# Patient Record
Sex: Female | Born: 1980 | Hispanic: No | Marital: Married | Smoking: Never smoker
Health system: Southern US, Community
[De-identification: ages and names within clinical notes are randomized; demographics above are authoritative.]

## PROBLEM LIST (undated history)

## (undated) DIAGNOSIS — N90810 Female genital mutilation status, unspecified: Secondary | ICD-10-CM

## (undated) DIAGNOSIS — T7840XA Allergy, unspecified, initial encounter: Secondary | ICD-10-CM

## (undated) HISTORY — DX: Allergy, unspecified, initial encounter: T78.40XA

## (undated) HISTORY — PX: CIRCUMCISION: SUR203

## (undated) HISTORY — DX: Female genital mutilation status, unspecified: N90.810

---

## 2007-03-24 ENCOUNTER — Inpatient Hospital Stay (HOSPITAL_COMMUNITY): Admission: AD | Admit: 2007-03-24 | Discharge: 2007-03-24 | Payer: Self-pay | Admitting: Obstetrics & Gynecology

## 2007-03-24 ENCOUNTER — Ambulatory Visit: Payer: Self-pay | Admitting: Physician Assistant

## 2007-04-11 ENCOUNTER — Ambulatory Visit: Payer: Self-pay | Admitting: Obstetrics & Gynecology

## 2007-05-02 ENCOUNTER — Ambulatory Visit (HOSPITAL_COMMUNITY): Admission: RE | Admit: 2007-05-02 | Discharge: 2007-05-02 | Payer: Self-pay | Admitting: Family Medicine

## 2007-05-02 ENCOUNTER — Ambulatory Visit: Payer: Self-pay | Admitting: Obstetrics & Gynecology

## 2007-05-30 ENCOUNTER — Encounter: Payer: Self-pay | Admitting: Obstetrics and Gynecology

## 2007-05-30 ENCOUNTER — Ambulatory Visit: Payer: Self-pay | Admitting: Obstetrics & Gynecology

## 2007-06-06 ENCOUNTER — Ambulatory Visit: Payer: Self-pay | Admitting: Obstetrics & Gynecology

## 2007-06-13 ENCOUNTER — Ambulatory Visit: Payer: Self-pay | Admitting: Obstetrics & Gynecology

## 2007-06-18 ENCOUNTER — Ambulatory Visit: Payer: Self-pay | Admitting: Obstetrics and Gynecology

## 2007-06-19 ENCOUNTER — Inpatient Hospital Stay (HOSPITAL_COMMUNITY): Admission: AD | Admit: 2007-06-19 | Discharge: 2007-06-19 | Payer: Self-pay | Admitting: Obstetrics and Gynecology

## 2007-06-19 ENCOUNTER — Ambulatory Visit: Payer: Self-pay | Admitting: Physician Assistant

## 2007-06-19 ENCOUNTER — Inpatient Hospital Stay (HOSPITAL_COMMUNITY): Admission: AD | Admit: 2007-06-19 | Discharge: 2007-06-21 | Payer: Self-pay | Admitting: Obstetrics & Gynecology

## 2007-08-13 ENCOUNTER — Other Ambulatory Visit: Admission: RE | Admit: 2007-08-13 | Discharge: 2007-08-13 | Payer: Self-pay | Admitting: Gynecology

## 2007-09-19 ENCOUNTER — Ambulatory Visit: Payer: Self-pay | Admitting: Obstetrics and Gynecology

## 2009-02-19 ENCOUNTER — Ambulatory Visit: Payer: Self-pay | Admitting: Obstetrics and Gynecology

## 2009-11-30 ENCOUNTER — Inpatient Hospital Stay (HOSPITAL_COMMUNITY): Admission: AD | Admit: 2009-11-30 | Discharge: 2009-12-02 | Payer: Self-pay | Admitting: Obstetrics and Gynecology

## 2010-03-28 ENCOUNTER — Encounter: Payer: Self-pay | Admitting: *Deleted

## 2010-05-20 LAB — CBC
HCT: 25.3 % — ABNORMAL LOW (ref 36.0–46.0)
HCT: 35.2 % — ABNORMAL LOW (ref 36.0–46.0)
Hemoglobin: 11.9 g/dL — ABNORMAL LOW (ref 12.0–15.0)
MCH: 30.9 pg (ref 26.0–34.0)
MCHC: 33.8 g/dL (ref 30.0–36.0)
MCHC: 34 g/dL (ref 30.0–36.0)
MCV: 92.4 fL (ref 78.0–100.0)
RDW: 14.1 % (ref 11.5–15.5)

## 2010-07-20 NOTE — Group Therapy Note (Signed)
[name], [name]              ACCOUNT NO.:  [id number]   MEDICAL RECORD NO.:  [id number]          PATIENT TYPE:  WOC   LOCATION:  [facility]                   FACILITY:  [facility]   PHYSICIAN:  Deirdre Christy Gentles, CNM       DATE OF BIRTH:  21-Nov-1980   DATE OF SERVICE:                                  CLINIC NOTE   REASON FOR VISIT:  Postpartum check and needs contraception.   HISTORY:  This is 30 year old G2, P2-0-02 who is now 4-weeks post partum  and doing well.  She is breast-feeding with supplementation.  She has  not had a period.  Her lochia stopped about a month ago.  She is back to  her usual activity.  She desires an IUD for contraception.  She is aware  of the alternatives into including Imferon, OCPs, and Depo-Provera.  She  is counseled, today, about the risks of procedure; including infection,  bleeding, damage to the back of the uterus.  She is also informed of the  possibility of hormonal side effects, and she was concerned about having  hair loss on the OCPs in the past.  Some of the minor side effects  include mood changes, headaches, breast tenderness, acne, nausea, and  she accepts the risks.  She is also counseled on the possibility of  failure or expulsion of the IUD, and the fact that she may have some  abnormal irregular spotting in her first several cycles; however, this  is probably variable since she is still breast-feeding.  She also notes,  that after a year or so, she may have very light menses or no menses;  and she understands and accepts the above using the husband as  Nurse, learning disability for all of that.   PHYSICAL EXAM:  VITAL SIGNS:  Temperature 97.9, pulse 81, BP 105/69,  weight 141.  GENERAL:  WN WD NAD.  HEENT:  Normal good dentition.  NECK:  Thyroid NSSP.  HEART:  RRR without murmur.  LUNGS:  Clear to auscultation bilateral.  ABDOMEN:  Soft, flat, nontender, no organomegaly.  A 4 cm rectus  diastasis.  EXTREMITIES:  Without varicosities or edema.  Pulses  full bilaterally.  PELVIC EXAM:  Normal external genitalia.  At this point Dr. Ancil Boozer is doing the pelvic exam, and IUD insertion.   PROCEDURE NOTE:  After informed consent as above.  The patient is in  dorsal lithotomy position.  Bimanual position confirms anteverted  uterus.  Speculum is inserted and cervix comes into view.  Anterior lip  is grasped with a single tooth tenaculum at 10 and 2 o'clock.  The  uterus sounded to 8 cm.  The Mirena IUD was inserted without difficulty.  Strings were cut to 2.5 cm.  The patient tolerated this procedure well.  There was very little spotting, and she was taught on how to do a string  check after her period, and they will be calling to get a string check  here after she does start having menses.           ______________________________  Caren Griffins, CNM    DP/MEDQ  D:  09/19/2007  T:  09/19/2007  Job:  [id number]

## 2010-11-26 LAB — POCT URINALYSIS DIP (DEVICE)
Glucose, UA: NEGATIVE
Glucose, UA: NEGATIVE
Nitrite: NEGATIVE
Operator id: 297281
Operator id: 297281
Specific Gravity, Urine: 1.015
Specific Gravity, Urine: 1.02
Urobilinogen, UA: 0.2
Urobilinogen, UA: 0.2

## 2010-11-26 LAB — URINALYSIS, ROUTINE W REFLEX MICROSCOPIC
Glucose, UA: NEGATIVE
Hgb urine dipstick: NEGATIVE
Specific Gravity, Urine: <1.005 — ABNORMAL LOW
Urobilinogen, UA: 0.2

## 2010-11-26 LAB — WET PREP, GENITAL: Clue Cells Wet Prep HPF POC: NONE SEEN

## 2010-11-29 LAB — POCT URINALYSIS DIP (DEVICE)
Glucose, UA: NEGATIVE
Nitrite: NEGATIVE
Operator id: 200901
Specific Gravity, Urine: 1.015
Urobilinogen, UA: 0.2

## 2010-11-30 LAB — POCT URINALYSIS DIP (DEVICE)
Glucose, UA: NEGATIVE
Glucose, UA: NEGATIVE
Nitrite: NEGATIVE
Nitrite: NEGATIVE
Operator id: 120861
Operator id: 120861
Protein, ur: NEGATIVE
Protein, ur: NEGATIVE
Urobilinogen, UA: 0.2
Urobilinogen, UA: 0.2

## 2010-11-30 LAB — CBC
MCV: 92.5
Platelets: 248
RDW: 12.8
WBC: 11.9 — ABNORMAL HIGH

## 2010-11-30 LAB — RAPID HIV SCREEN (WH-MAU): Rapid HIV Screen: NONREACTIVE

## 2010-11-30 LAB — RPR: RPR Ser Ql: NONREACTIVE

## 2010-12-03 LAB — POCT PREGNANCY, URINE: Operator id: 297281

## 2012-01-19 ENCOUNTER — Telehealth: Payer: Self-pay | Admitting: Obstetrics and Gynecology

## 2012-01-19 MED ORDER — TRIAMCINOLONE 0.1 % CREAM:EUCERIN CREAM 1:1: 1.0000 "application " | TOPICAL_CREAM | Freq: Three times a day (TID) | CUTANEOUS | Status: DC | PRN

## 2012-01-19 NOTE — Telephone Encounter (Signed)
Tc to pt's husband. Pt's needs rf for Triamcinolone cream for vulvar eczema. Pt was due for AEX after 12/30/11. Pt has to pay a balance before appt can be sched. Pt's husband voices understanding. Rx for Triamcinolone cream e-pres to pharm on file. Pt's husband declines sched AEX @ this time.

## 2012-05-09 ENCOUNTER — Encounter: Payer: Self-pay | Admitting: Obstetrics and Gynecology

## 2012-05-09 ENCOUNTER — Ambulatory Visit: Payer: BC Managed Care – PPO | Admitting: Obstetrics and Gynecology

## 2012-05-09 VITALS — BP 98/58 | Ht 67.5 in | Wt 158.0 lb

## 2012-05-09 LAB — HM PAP SMEAR: HM Pap smear: NEGATIVE

## 2012-05-09 MED ORDER — TRIAMCINOLONE 0.1 % CREAM:EUCERIN CREAM 1:1: 1.0000 "application " | TOPICAL_CREAM | Freq: Three times a day (TID) | CUTANEOUS | Status: DC | PRN

## 2012-05-09 NOTE — Addendum Note (Signed)
Addended by: Darien Ramus on: 05/09/2012 11:53 AM   Modules accepted: Orders

## 2012-05-09 NOTE — Progress Notes (Signed)
The patient reports:no complaints  Contraception:IUD x 2011  Last mammogram;none  Last pap:12/28/2010 Normal  GC/Chlamydia cultures offered: declined HIV/RPR/HbsAg offered:  declined HSV 1 and 2 glycoprotein offered: declined  Menstrual cycle regular and monthly: Yes every 25- 28 days  Menstrual flow normal: Yes not heavy lasts 4 days  Urinary symptoms: none Normal bowel movements: Yes Reports abuse at home: No:   Subjective:    Kimberly Brock is a 32 y.o. female, No obstetric history on file., who presents for an annual exam.     History   Social History  . Marital Status: Married    Spouse Name: N/A    Number of Children: N/A  . Years of Education: N/A   Social History Main Topics  . Smoking status: Not on file  . Smokeless tobacco: Not on file  . Alcohol Use: Not on file  . Drug Use: Not on file  . Sexually Active: Not on file   Other Topics Concern  . Not on file   Social History Narrative  . No narrative on file    Menstrual cycle:   LMP: No LMP recorded.           Cycle: normal  The following portions of the patient's history were reviewed and updated as appropriate: allergies, current medications, past family history, past medical history, past social history, past surgical history and problem list.  Review of Systems Pertinent items are noted in HPI. Breast:Negative for breast lump,nipple discharge or nipple retraction Gastrointestinal: Negative for abdominal pain, change in bowel habits or rectal bleeding Urinary:negative   Objective:    BP 98/58  Ht 5' 7.5" (1.715 m)  Wt 158 lb (71.668 kg)  BMI 24.37 kg/m2  LMP 04/25/2012    Weight:  Wt Readings from Last 1 Encounters:  No data found for Wt          BMI: Body mass index is 24.37 kg/(m^2).  General Appearance: Alert, appropriate appearance for age. No acute distress HEENT: Grossly normal Neck / Thyroid: Supple, no masses, nodes or enlargement Lungs: clear to auscultation bilaterally Back:  No CVA tenderness Breast Exam: No masses or nodes.No dimpling, nipple retraction or discharge. Cardiovascular: Regular rate and rhythm. S1, S2, no murmur Gastrointestinal: Soft, non-tender, no masses or organomegaly Pelvic Exam: Vulva and vagina appear normal. Bimanual exam reveals normal uterus and adnexa.Strings + Rectovaginal: not indicated Lymphatic Exam: Non-palpable nodes in neck, clavicular, axillary, or inguinal regions Skin: no rash or abnormalities Neurologic: Normal gait and speech, no tremor  Psychiatric: Alert and oriented, appropriate affect.    Assessment:    Normal gyn exam    Plan:    pap smear done return annually or prn STD screening: declined Contraception:IUD Referral to Wynona Meals Rivard MD

## 2012-05-10 LAB — PAP IG W/ RFLX HPV ASCU

## 2012-06-14 ENCOUNTER — Ambulatory Visit: Payer: Self-pay | Admitting: Internal Medicine

## 2012-06-20 ENCOUNTER — Encounter: Payer: Self-pay | Admitting: Internal Medicine

## 2012-06-20 ENCOUNTER — Ambulatory Visit (INDEPENDENT_AMBULATORY_CARE_PROVIDER_SITE_OTHER): Payer: BC Managed Care – PPO | Admitting: Internal Medicine

## 2012-06-20 ENCOUNTER — Ambulatory Visit (INDEPENDENT_AMBULATORY_CARE_PROVIDER_SITE_OTHER)
Admission: RE | Admit: 2012-06-20 | Discharge: 2012-06-20 | Disposition: A | Discharge status: Home / Self Care | Payer: BC Managed Care – PPO | Source: Ambulatory Visit | Attending: Internal Medicine | Admitting: Internal Medicine

## 2012-06-20 ENCOUNTER — Other Ambulatory Visit (INDEPENDENT_AMBULATORY_CARE_PROVIDER_SITE_OTHER): Payer: BC Managed Care – PPO

## 2012-06-20 VITALS — BP 108/70 | HR 68 | Temp 98.1°F | Ht 67.5 in | Wt 161.5 lb

## 2012-06-20 DIAGNOSIS — Z13220 Encounter for screening for lipoid disorders: Secondary | ICD-10-CM

## 2012-06-20 DIAGNOSIS — M533 Sacrococcygeal disorders, not elsewhere classified: Secondary | ICD-10-CM

## 2012-06-20 DIAGNOSIS — Z131 Encounter for screening for diabetes mellitus: Secondary | ICD-10-CM

## 2012-06-20 DIAGNOSIS — Z130 Encounter for screening for diseases of the blood and blood-forming organs and certain disorders involving the immune mechanism: Secondary | ICD-10-CM

## 2012-06-20 DIAGNOSIS — Z1329 Encounter for screening for other suspected endocrine disorder: Secondary | ICD-10-CM

## 2012-06-20 DIAGNOSIS — Z0000 Encounter for general adult medical examination without abnormal findings: Secondary | ICD-10-CM

## 2012-06-20 LAB — BASIC METABOLIC PANEL
BUN: 11 mg/dL (ref 6–23)
Chloride: 101 mEq/L (ref 96–112)
Creatinine, Ser: 0.6 mg/dL (ref 0.4–1.2)
Glucose, Bld: 94 mg/dL (ref 70–99)
Potassium: 3.6 mEq/L (ref 3.5–5.1)

## 2012-06-20 LAB — HEMOGLOBIN A1C: Hgb A1c MFr Bld: 5.4 % (ref 4.6–6.5)

## 2012-06-20 LAB — CBC
Hemoglobin: 13.4 g/dL (ref 12.0–15.0)
MCHC: 32.9 g/dL (ref 30.0–36.0)
Platelets: 282 10*3/uL (ref 150.0–400.0)
RBC: 4.39 Mil/uL (ref 3.87–5.11)

## 2012-06-20 LAB — LIPID PANEL
Cholesterol: 135 mg/dL (ref 0–200)
LDL Cholesterol: 56 mg/dL (ref 0–99)
Total CHOL/HDL Ratio: 2
VLDL: 23 mg/dL (ref 0.0–40.0)

## 2012-06-20 LAB — TSH: TSH: 0.95 u[IU]/mL (ref 0.35–5.50)

## 2012-06-20 NOTE — Patient Instructions (Signed)
Health Maintenance, Females A healthy lifestyle and preventative care can promote health and wellness.  Maintain regular health, dental, and eye exams.  Eat a healthy diet. Foods like vegetables, fruits, whole grains, low-fat dairy products, and lean protein foods contain the nutrients you need without too many calories. Decrease your intake of foods high in solid fats, added sugars, and salt. Get information about a proper diet from your caregiver, if necessary.  Regular physical exercise is one of the most important things you can do for your health. Most adults should get at least 150 minutes of moderate-intensity exercise (any activity that increases your heart rate and causes you to sweat) each week. In addition, most adults need muscle-strengthening exercises on 2 or more days a week.   Maintain a healthy weight. The body mass index (BMI) is a screening tool to identify possible weight problems. It provides an estimate of body fat based on height and weight. Your caregiver can help determine your BMI, and can help you achieve or maintain a healthy weight. For adults 20 years and older:  A BMI below 18.5 is considered underweight.  A BMI of 18.5 to 24.9 is normal.  A BMI of 25 to 29.9 is considered overweight.  A BMI of 30 and above is considered obese.  Maintain normal blood lipids and cholesterol by exercising and minimizing your intake of saturated fat. Eat a balanced diet with plenty of fruits and vegetables. Blood tests for lipids and cholesterol should begin at age 20 and be repeated every 5 years. If your lipid or cholesterol levels are high, you are over 50, or you are a high risk for heart disease, you may need your cholesterol levels checked more frequently.Ongoing high lipid and cholesterol levels should be treated with medicines if diet and exercise are not effective.  If you smoke, find out from your caregiver how to quit. If you do not use tobacco, do not start.  If you  are pregnant, do not drink alcohol. If you are breastfeeding, be very cautious about drinking alcohol. If you are not pregnant and choose to drink alcohol, do not exceed 1 drink per day. One drink is considered to be 12 ounces (355 mL) of beer, 5 ounces (148 mL) of wine, or 1.5 ounces (44 mL) of liquor.  Avoid use of street drugs. Do not share needles with anyone. Ask for help if you need support or instructions about stopping the use of drugs.  High blood pressure causes heart disease and increases the risk of stroke. Blood pressure should be checked at least every 1 to 2 years. Ongoing high blood pressure should be treated with medicines, if weight loss and exercise are not effective.  If you are 55 to 31 years old, ask your caregiver if you should take aspirin to prevent strokes.  Diabetes screening involves taking a blood sample to check your fasting blood sugar level. This should be done once every 3 years, after age 45, if you are within normal weight and without risk factors for diabetes. Testing should be considered at a younger age or be carried out more frequently if you are overweight and have at least 1 risk factor for diabetes.  Breast cancer screening is essential preventative care for women. You should practice "breast self-awareness." This means understanding the normal appearance and feel of your breasts and may include breast self-examination. Any changes detected, no matter how small, should be reported to a caregiver. Women in their 20s and 30s should have   a clinical breast exam (CBE) by a caregiver as part of a regular health exam every 1 to 3 years. After age 40, women should have a CBE every year. Starting at age 40, women should consider having a mammogram (breast X-ray) every year. Women who have a family history of breast cancer should talk to their caregiver about genetic screening. Women at a high risk of breast cancer should talk to their caregiver about having an MRI and a  mammogram every year.  The Pap test is a screening test for cervical cancer. Women should have a Pap test starting at age 21. Between ages 21 and 29, Pap tests should be repeated every 2 years. Beginning at age 30, you should have a Pap test every 3 years as long as the past 3 Pap tests have been normal. If you had a hysterectomy for a problem that was not cancer or a condition that could lead to cancer, then you no longer need Pap tests. If you are between ages 65 and 70, and you have had normal Pap tests going back 10 years, you no longer need Pap tests. If you have had past treatment for cervical cancer or a condition that could lead to cancer, you need Pap tests and screening for cancer for at least 20 years after your treatment. If Pap tests have been discontinued, risk factors (such as a new sexual partner) need to be reassessed to determine if screening should be resumed. Some women have medical problems that increase the chance of getting cervical cancer. In these cases, your caregiver may recommend more frequent screening and Pap tests.  The human papillomavirus (HPV) test is an additional test that may be used for cervical cancer screening. The HPV test looks for the virus that can cause the cell changes on the cervix. The cells collected during the Pap test can be tested for HPV. The HPV test could be used to screen women aged 30 years and older, and should be used in women of any age who have unclear Pap test results. After the age of 30, women should have HPV testing at the same frequency as a Pap test.  Colorectal cancer can be detected and often prevented. Most routine colorectal cancer screening begins at the age of 50 and continues through age 75. However, your caregiver may recommend screening at an earlier age if you have risk factors for colon cancer. On a yearly basis, your caregiver may provide home test kits to check for hidden blood in the stool. Use of a small camera at the end of a  tube, to directly examine the colon (sigmoidoscopy or colonoscopy), can detect the earliest forms of colorectal cancer. Talk to your caregiver about this at age 50, when routine screening begins. Direct examination of the colon should be repeated every 5 to 10 years through age 75, unless early forms of pre-cancerous polyps or small growths are found.  Hepatitis C blood testing is recommended for all people born from 1945 through 1965 and any individual with known risks for hepatitis C.  Practice safe sex. Use condoms and avoid high-risk sexual practices to reduce the spread of sexually transmitted infections (STIs). Sexually active women aged 25 and younger should be checked for Chlamydia, which is a common sexually transmitted infection. Older women with new or multiple partners should also be tested for Chlamydia. Testing for other STIs is recommended if you are sexually active and at increased risk.  Osteoporosis is a disease in which the   bones lose minerals and strength with aging. This can result in serious bone fractures. The risk of osteoporosis can be identified using a bone density scan. Women ages 65 and over and women at risk for fractures or osteoporosis should discuss screening with their caregivers. Ask your caregiver whether you should be taking a calcium supplement or vitamin D to reduce the rate of osteoporosis.  Menopause can be associated with physical symptoms and risks. Hormone replacement therapy is available to decrease symptoms and risks. You should talk to your caregiver about whether hormone replacement therapy is right for you.  Use sunscreen with a sun protection factor (SPF) of 30 or greater. Apply sunscreen liberally and repeatedly throughout the day. You should seek shade when your shadow is shorter than you. Protect yourself by wearing long sleeves, pants, a wide-brimmed hat, and sunglasses year round, whenever you are outdoors.  Notify your caregiver of new moles or  changes in moles, especially if there is a change in shape or color. Also notify your caregiver if a mole is larger than the size of a pencil eraser.  Stay current with your immunizations. Document Released: 09/06/2010 Document Revised: 05/16/2011 Document Reviewed: 09/06/2010 Exit[employer] Patient Information 2013 [employer]. Breast Self-Exam A self breast exam may help you find changes or problems while they are still small. Do a breast self-exam:  Every month.  One week after your period (menstrual period).  On the first day of each month if you do not have periods anymore. Look for any:  Change in breast color, size, or shape.  Dimples in your breast.  Changes in your nipples or skin.  Dry skin on your breasts or nipples.  Watery or bloody discharge from your nipples.  Feel for:  Lumps.  Thick, hard places.  Any other changes. HOME CARE There are 3 ways to do the breast self-exam: In front of a mirror.  Lift your arms over your head and turn side to side.  Put your hands on your hips and lean down, then turn from side to side.  Bend forward and turn from side to side. In the shower.  With soapy hands, check both breasts. Then check above and below your collarbone and your armpits.  Feel above and below your collarbone down to under your breast, and from the center of your chest to the outer edge of the armpit. Check for any lumps or hard spots.  Using the tips of your middle three fingers check your whole breast by pressing your hand over your breast in a circle or in an up and down motion. Lying down.  Lie flat on your bed.  Put a small pillow under the breast you are going to check. On that same side, put your hand behind your head.  With your other hand, use the 3 middle fingers to feel the breast.  Move your fingers in a circle around the breast. Press firmly over all parts of the breast to feel for any lumps. GET HELP RIGHT AWAY IF: You find any  changes in your breasts so they can be checked. Document Released: 08/10/2007 Document Revised: 05/16/2011 Document Reviewed: 06/11/2008 [employer] Patient Information 2013 [employer].  

## 2012-06-20 NOTE — Progress Notes (Signed)
HPI  Pt presents to the clinic today to establish care. She has not had a PCP in a number of years. She does have some concerns today about pain in her tailbone. She fell about 1 year down a flight of stairs. She immediately had pain in her tailbone. The pain gradually subsided over 3-4 months but a few weeks ago the pain has returned. It hurts worse when she sits down. She has not taken anything for the pain. She is unable to rate the pain. She wonders if it may be broken or healed wrong.  Vaccines: pt does not take vaccines LMP: 05/2012 Pap smear: 05/2012  History reviewed. No pertinent past medical history.  Current Outpatient Prescriptions  Medication Sig Dispense Refill  . Biotin 5000 MCG CAPS Take 1 capsule by mouth daily.      . Fenugreek 610 MG CAPS Take 1 capsule by mouth daily.      Marland Kitchen levonorgestrel (MIRENA) 20 MCG/24HR IUD 1 each by Intrauterine route once.       No current facility-administered medications for this visit.    No Known Allergies  Family History  Problem Relation Age of Onset  . Cancer Neg Hx   . Stroke Neg Hx     History   Social History  . Marital Status: Married    Spouse Name: N/A    Number of Children: 2  . Years of Education: 16   Occupational History  . Not on file.   Social History Main Topics  . Smoking status: Never Smoker   . Smokeless tobacco: Never Used  . Alcohol Use: No  . Drug Use: No  . Sexually Active: Yes    Birth Control/ Protection: IUD   Other Topics Concern  . Not on file   Social History Narrative  . No narrative on file    ROS:  Constitutional: Denies fever, malaise, fatigue, headache or abrupt weight changes.  HEENT: Denies eye pain, eye redness, ear pain, ringing in the ears, wax buildup, runny nose, nasal congestion, bloody nose, or sore throat. Respiratory: Denies difficulty breathing, shortness of breath, cough or sputum production.   Cardiovascular: Denies chest pain, chest tightness, palpitations or  swelling in the hands or feet.  Gastrointestinal: Denies abdominal pain, bloating, constipation, diarrhea or blood in the stool.  GU: Denies frequency, urgency, pain with urination, blood in urine, odor or discharge. Musculoskeletal: Pt reports pain in her tailbone. Denies decrease in range of motion, difficulty with gait, muscle pain or joint pain and swelling.  Skin: Denies redness, rashes, lesions or ulcercations.  Neurological: Denies dizziness, difficulty with memory, difficulty with speech or problems with balance and coordination.   No other specific complaints in a complete review of systems (except as listed in HPI above).  PE:  BP 108/70  Pulse 68  Temp(Src) 98.1 F (36.7 C) (Oral)  Ht 5' 7.5" (1.715 m)  Wt 161 lb 8 oz (73.256 kg)  BMI 24.91 kg/m2  SpO2 98%  LMP 06/10/2012 Wt Readings from Last 3 Encounters:  06/20/12 161 lb 8 oz (73.256 kg)  05/09/12 158 lb (71.668 kg)    General: Appears her stated age, well developed, well nourished in NAD. HEENT: Head: normal shape and size; Eyes: sclera white, no icterus, conjunctiva pink, PERRLA and EOMs intact; Ears: Tm's gray and intact, normal light reflex; Nose: mucosa pink and moist, septum midline; Throat/Mouth: Teeth present, mucosa pink and moist, no lesions or ulcerations noted.  Neck: Normal range of motion. Neck supple, trachea  midline. No massses, lumps or thyromegaly present.  Cardiovascular: Normal rate and rhythm. S1,S2 noted.  No murmur, rubs or gallops noted. No JVD or BLE edema. No carotid bruits noted. Pulmonary/Chest: Normal effort and positive vesicular breath sounds. No respiratory distress. No wheezes, rales or ronchi noted.  Abdomen: Soft and nontender. Normal bowel sounds, no bruits noted. No distention or masses noted. Liver, spleen and kidneys non palpable. Musculoskeletal: Normal range of motion. No signs of joint swelling. No difficulty with gait.  Neurological: Alert and oriented. Cranial nerves II-XII  intact. Coordination normal. +DTRs bilaterally. Psychiatric: Mood and affect normal. Behavior is normal. Judgment and thought content normal.     Assessment and Plan:  Preventative Health maintenance:  Pt declines vaccines Will obtain basic screening labs today  Sacral pain:  Will obtain xray of sacrum to r/o old fracture of delayed healing  RTC in1 year or sooner if needed

## 2012-07-24 ENCOUNTER — Ambulatory Visit (INDEPENDENT_AMBULATORY_CARE_PROVIDER_SITE_OTHER): Payer: BC Managed Care – PPO | Admitting: Internal Medicine

## 2012-07-24 ENCOUNTER — Encounter: Payer: Self-pay | Admitting: Internal Medicine

## 2012-07-24 VITALS — BP 112/78 | HR 73 | Temp 98.0°F | Ht 67.5 in | Wt 168.8 lb

## 2012-07-24 DIAGNOSIS — B54 Unspecified malaria: Secondary | ICD-10-CM

## 2012-07-24 MED ORDER — CHLOROQUINE PHOSPHATE 500 MG PO TABS: ORAL_TABLET | ORAL | Status: DC

## 2012-07-24 NOTE — Progress Notes (Signed)
  Subjective:    Patient ID: Kimberly Brock, female    DOB: 06-08-1980, 32 y.o.   MRN: [id number]  HPI  Pt presents to the clinic today for a travel visit. They plan to leave for [place] in 1 week. They will be gone for 11 weeks. They have had all there recommended vaccines but she does need an RX for Malaria prevention.  Review of Systems      History reviewed. No pertinent past medical history.  Current Outpatient Prescriptions  Medication Sig Dispense Refill  . Biotin 5000 MCG CAPS Take 1 capsule by mouth daily.      . Fenugreek 610 MG CAPS Take 1 capsule by mouth daily.      Marland Kitchen levonorgestrel (MIRENA) 20 MCG/24HR IUD 1 each by Intrauterine route once.      . chloroquine (ARALEN) 500 MG tablet Take 1 tablet weekly, begin 2 weeks prior to travel and complete 8 weeks after travel. Take 1 tablet weekly atleast 1 week prior to travel, once weekly while traveling and once weekly for 4 weeks after travel.  16 tablet  0   No current facility-administered medications for this visit.    No Known Allergies  Family History  Problem Relation Age of Onset  . Cancer Neg Hx   . Stroke Neg Hx     History   Social History  . Marital Status: Married    Spouse Name: N/A    Number of Children: 2  . Years of Education: 16   Occupational History  . Not on file.   Social History Main Topics  . Smoking status: Never Smoker   . Smokeless tobacco: Never Used  . Alcohol Use: No  . Drug Use: No  . Sexually Active: Yes    Birth Control/ Protection: IUD   Other Topics Concern  . Not on file   Social History Narrative  . No narrative on file     Constitutional: Denies fever, malaise, fatigue, headache or abrupt weight changes.  Respiratory: Denies difficulty breathing, shortness of breath, cough or sputum production.   Cardiovascular: Denies chest pain, chest tightness, palpitations or swelling in the hands or feet. .  Neurological: Denies dizziness, difficulty with memory, difficulty  with speech or problems with balance and coordination.   No other specific complaints in a complete review of systems (except as listed in HPI above).  Objective:   Physical Exam    BP 112/78  Pulse 73  Temp(Src) 98 F (36.7 C) (Oral)  Ht 5' 7.5" (1.715 m)  Wt 168 lb 12 oz (76.544 kg)  BMI 26.02 kg/m2  SpO2 98%  LMP 07/06/2012 Wt Readings from Last 3 Encounters:  07/24/12 168 lb 12 oz (76.544 kg)  06/20/12 161 lb 8 oz (73.256 kg)  05/09/12 158 lb (71.668 kg)    General: Appears  her stated age, well developed, well nourished in NAD.Marland Kitchen  Cardiovascular: Normal rate and rhythm. S1,S2 noted.  No murmur, rubs or gallops noted. No JVD or BLE edema. No carotid bruits noted. Pulmonary/Chest: Normal effort and positive vesicular breath sounds. No respiratory distress. No wheezes, rales or ronchi noted.  Neurological: Alert and oriented. Cranial nerves II-XII intact. Coordination normal. +DTRs bilaterally.      Assessment & Plan:   Traveling to [place], needs malaria prevention:  eRx for chloroquine, instructions for use given  RTC as needed

## 2012-07-24 NOTE — Patient Instructions (Signed)
Chloroquine tablets What is this medicine? CHLOROQUINE (KLOR oh kwin) is used to treat or prevent malaria infections. It is also used to treat amebiasis. This medicine may be used for other purposes; ask your health care provider or pharmacist if you have questions. What should I tell my health care provider before I take this medicine? They need to know if you have any of these conditions: -eye disease, vision problems -glucose 6-phosphate dehydrogenase (G6PD) deficiency -hearing problems -liver disease -psoriasis -history of seizures -an unusual or allergic reaction to chloroquine, hydroxychloroquine, other medicines, foods, dyes, or preservatives -pregnant or trying to get pregnant -breast-feeding How should I use this medicine? Take this medicine by mouth with a glass of water. Follow the directions on the prescription label. To prevent malaria, take this medicine on the same day each week starting 2 weeks before entering the endemic area and continue for 8 weeks after leaving. Take your doses at regular intervals. Do not take your medicine more often than directed. Talk to your pediatrician regarding the use of this medicine in children. While this drug may be prescribed for selected conditions, precautions do apply. Overdosage: If you think you have taken too much of this medicine contact a poison control center or emergency room at once. NOTE: This medicine is only for you. Do not share this medicine with others. What if I miss a dose? If you miss a dose, take it as soon as you can. If it is almost time for your next dose, take only that dose. Do not take double or extra doses. What may interact with this medicine? Do not take this medicine with any of the following medications: -arsenic trioxide -chlorpromazine -cisapride -droperidol -medicines for depression, anxiety, or psychotic disturbances -medicines for irregular heartbeats,  rhythm -methadone -pentamidine -ranolazine -some antibiotics like erythromycin, levofloxacin This medicine may also interact with the following medications: -ampicillin -antacids -cimetidine -cyclosporine -kaolin This list may not describe all possible interactions. Give your health care provider a list of all the medicines, herbs, non-prescription drugs, or dietary supplements you use. Also tell them if you smoke, drink alcohol, or use illegal drugs. Some items may interact with your medicine. What should I watch for while using this medicine? Tell your doctor or health care professional if your symptoms do not start to get better in a few days. If you are taking this medicine for a long time, visit your doctor or health care professional for regular checks. If you notice any changes in your vision see your eye doctor for an eye exam. If you get a fever during or after you start taking this medicine, do not treat yourself. Contact your doctor or health care professional immediately. You may get drowsy or dizzy. Do not drive, use machinery, or do anything that needs mental alertness until you know how this medicine affects you. Do not stand or sit up quickly, especially if you are an older patient. This reduces the risk of dizzy or fainting spells. While in areas where malaria is common, you should take steps to prevent being bit by mosquitos. This includes staying in air-conditioned or well-screened rooms to reduce human-mosquito contact, sleep under mosquito netting, preferably one with pyrethrum-containing insecticide, wear long-sleeved shirts or blouses and long trousers to protect arms and legs, apply mosquito repellents containing DEET to uncovered areas of skin, and use a pyrethrum-containing flying insect spray to kill mosquitos. This medicine can make you more sensitive to the sun. Keep out of the sun. If you cannot  avoid being in the sun, wear protective clothing and use sunscreen. Do not  use sun lamps or tanning beds/booths. Avoid products with antacids and kaolin for 4 hours before and after taking a dose of this medicine. What side effects may I notice from receiving this medicine? Side effects that you should report to your doctor or health care professional as soon as possible: -allergic reactions like skin rash, itching or hives, swelling of the face, lips, or tongue -changes in vision -hallucinations -hearing loss or ringing -feeling faint, lightheaded -fever or infection -muscle weakness -numbness, tingling -seizures -unusual bleeding or bruising -unusually weak or tired Side effects that usually do not require medical attention (report to your doctor or health care professional if they continue or are bothersome): -bleaching of body hair -blue-black color to the skin, nails -diarrhea -hair loss -headache -loss of appetite -nausea, vomiting -stomach cramps This list may not describe all possible side effects. Call your doctor for medical advice about side effects. You may report side effects to FDA at 1-800-FDA-1088. Where should I keep my medicine? Keep out of the reach of children. In children, this medicine can cause overdose with small doses. Store at room temperature between 15 and 30 degrees C (59 and 86 degrees F). Throw away any unused medicine after the expiration date. NOTE: This sheet is a summary. It may not cover all possible information. If you have questions about this medicine, talk to your doctor, pharmacist, or health care provider.  2013, [employer]. (05/28/2007 4:58:40 PM)

## 2013-02-08 LAB — OB RESULTS CONSOLE RUBELLA ANTIBODY, IGM: RUBELLA: IMMUNE

## 2013-02-08 LAB — OB RESULTS CONSOLE ABO/RH: RH Type: POSITIVE

## 2013-02-08 LAB — OB RESULTS CONSOLE ANTIBODY SCREEN: Antibody Screen: NEGATIVE

## 2013-02-08 LAB — OB RESULTS CONSOLE GC/CHLAMYDIA
CHLAMYDIA, DNA PROBE: NEGATIVE
Gonorrhea: NEGATIVE

## 2013-02-08 LAB — OB RESULTS CONSOLE HEPATITIS B SURFACE ANTIGEN: Hepatitis B Surface Ag: NEGATIVE

## 2013-03-07 NOTE — L&D Delivery Note (Signed)
Delivery Note At 3:30 PM a viable female was delivered via Vaginal, Spontaneous Delivery (Presentation: Left Occiput Anterior).  APGAR: 8, 9; weight 8 lb 4.8 oz (3765 g).   Placenta status: Intact, Spontaneous.  Cord: 3 vessels with the following complications: None.  Cord pH: n/a  Anesthesia: Epidural  Episiotomy: None Lacerations: None Suture Repair: none Est. Blood Loss (mL): 250  Mom to postpartum.  Baby to Couplet care / Skin to Skin.  Kimberly Brock 10/09/2013, 7:03 PM

## 2013-09-12 LAB — OB RESULTS CONSOLE GBS: STREP GROUP B AG: NEGATIVE

## 2013-10-09 ENCOUNTER — Inpatient Hospital Stay (HOSPITAL_COMMUNITY)
Admission: AD | Admit: 2013-10-09 | Discharge: 2013-10-10 | DRG: 775 | Disposition: A | Discharge status: Home / Self Care | Payer: 59 | Source: Ambulatory Visit | Attending: Obstetrics and Gynecology | Admitting: Obstetrics and Gynecology

## 2013-10-09 ENCOUNTER — Inpatient Hospital Stay (HOSPITAL_COMMUNITY): Payer: 59 | Admitting: Anesthesiology

## 2013-10-09 ENCOUNTER — Encounter (HOSPITAL_COMMUNITY): Payer: 59 | Admitting: Anesthesiology

## 2013-10-09 ENCOUNTER — Encounter (HOSPITAL_COMMUNITY): Payer: Self-pay | Admitting: *Deleted

## 2013-10-09 DIAGNOSIS — N90810 Female genital mutilation status, unspecified: Secondary | ICD-10-CM

## 2013-10-09 DIAGNOSIS — O479 False labor, unspecified: Secondary | ICD-10-CM | POA: Diagnosis present

## 2013-10-09 DIAGNOSIS — Z789 Other specified health status: Secondary | ICD-10-CM

## 2013-10-09 LAB — CBC
HCT: 34.7 % — ABNORMAL LOW (ref 36.0–46.0)
HEMOGLOBIN: 11.5 g/dL — AB (ref 12.0–15.0)
MCH: 29.6 pg (ref 26.0–34.0)
MCHC: 33.1 g/dL (ref 30.0–36.0)
MCV: 89.4 fL (ref 78.0–100.0)
Platelets: 268 10*3/uL (ref 150–400)
RBC: 3.88 MIL/uL (ref 3.87–5.11)
RDW: 13.1 % (ref 11.5–15.5)
WBC: 7.1 10*3/uL (ref 4.0–10.5)

## 2013-10-09 LAB — RPR

## 2013-10-09 MED ORDER — OXYTOCIN 40 UNITS IN LACTATED RINGERS INFUSION - SIMPLE MED
62.5000 mL/h | INTRAVENOUS | Status: DC
  Filled 2013-10-09: qty 1000

## 2013-10-09 MED ORDER — ACETAMINOPHEN 325 MG PO TABS: 650.0000 mg | ORAL_TABLET | ORAL | Status: DC | PRN

## 2013-10-09 MED ORDER — BENZOCAINE-MENTHOL 20-0.5 % EX AERO
1.0000 "application " | INHALATION_SPRAY | CUTANEOUS | Status: DC | PRN
  Administered 2013-10-09: 1 via TOPICAL
  Filled 2013-10-09: qty 56

## 2013-10-09 MED ORDER — OXYTOCIN BOLUS FROM INFUSION: 500.0000 mL | INTRAVENOUS | Status: DC

## 2013-10-09 MED ORDER — LACTATED RINGERS IV SOLN: 500.0000 mL | INTRAVENOUS | Status: DC | PRN

## 2013-10-09 MED ORDER — ONDANSETRON HCL 4 MG PO TABS: 4.0000 mg | ORAL_TABLET | ORAL | Status: DC | PRN

## 2013-10-09 MED ORDER — OXYCODONE-ACETAMINOPHEN 5-325 MG PO TABS: 1.0000 | ORAL_TABLET | ORAL | Status: DC | PRN

## 2013-10-09 MED ORDER — PHENYLEPHRINE 40 MCG/ML (10ML) SYRINGE FOR IV PUSH (FOR BLOOD PRESSURE SUPPORT)
80.0000 ug | PREFILLED_SYRINGE | INTRAVENOUS | Status: DC | PRN
  Administered 2013-10-09 (×2): 80 ug via INTRAVENOUS
  Filled 2013-10-09: qty 2

## 2013-10-09 MED ORDER — ONDANSETRON HCL 4 MG/2ML IJ SOLN: 4.0000 mg | INTRAMUSCULAR | Status: DC | PRN

## 2013-10-09 MED ORDER — WITCH HAZEL-GLYCERIN EX PADS: 1.0000 "application " | MEDICATED_PAD | CUTANEOUS | Status: DC | PRN

## 2013-10-09 MED ORDER — EPHEDRINE 5 MG/ML INJ
10.0000 mg | INTRAVENOUS | Status: DC | PRN
  Filled 2013-10-09: qty 2

## 2013-10-09 MED ORDER — TETANUS-DIPHTH-ACELL PERTUSSIS 5-2.5-18.5 LF-MCG/0.5 IM SUSP
0.5000 mL | Freq: Once | INTRAMUSCULAR | Status: AC
  Administered 2013-10-10: 0.5 mL via INTRAMUSCULAR
  Filled 2013-10-09: qty 0.5

## 2013-10-09 MED ORDER — IBUPROFEN 600 MG PO TABS: 600.0000 mg | ORAL_TABLET | Freq: Four times a day (QID) | ORAL | Status: DC | PRN

## 2013-10-09 MED ORDER — LANOLIN HYDROUS EX OINT: TOPICAL_OINTMENT | CUTANEOUS | Status: DC | PRN

## 2013-10-09 MED ORDER — FENTANYL 2.5 MCG/ML BUPIVACAINE 1/10 % EPIDURAL INFUSION (WH - ANES)
INTRAMUSCULAR | Status: AC
  Administered 2013-10-09: 14 mL/h via EPIDURAL
  Filled 2013-10-09: qty 125

## 2013-10-09 MED ORDER — DIPHENHYDRAMINE HCL 50 MG/ML IJ SOLN: 12.5000 mg | INTRAMUSCULAR | Status: DC | PRN

## 2013-10-09 MED ORDER — LIDOCAINE HCL (PF) 1 % IJ SOLN
30.0000 mL | INTRAMUSCULAR | Status: DC | PRN
Start: 2013-10-09 — End: 2013-10-09
  Filled 2013-10-09: qty 30

## 2013-10-09 MED ORDER — LACTATED RINGERS IV SOLN
INTRAVENOUS | Status: DC
  Administered 2013-10-09 (×2): via INTRAVENOUS

## 2013-10-09 MED ORDER — LACTATED RINGERS IV SOLN
500.0000 mL | Freq: Once | INTRAVENOUS | Status: AC
  Administered 2013-10-09: 500 mL via INTRAVENOUS

## 2013-10-09 MED ORDER — PHENYLEPHRINE 40 MCG/ML (10ML) SYRINGE FOR IV PUSH (FOR BLOOD PRESSURE SUPPORT)
80.0000 ug | PREFILLED_SYRINGE | INTRAVENOUS | Status: DC | PRN
  Filled 2013-10-09: qty 2

## 2013-10-09 MED ORDER — FENTANYL 2.5 MCG/ML BUPIVACAINE 1/10 % EPIDURAL INFUSION (WH - ANES)
14.0000 mL/h | INTRAMUSCULAR | Status: DC | PRN
  Administered 2013-10-09: 14 mL/h via EPIDURAL

## 2013-10-09 MED ORDER — ZOLPIDEM TARTRATE 5 MG PO TABS: 5.0000 mg | ORAL_TABLET | Freq: Every evening | ORAL | Status: DC | PRN

## 2013-10-09 MED ORDER — OXYCODONE-ACETAMINOPHEN 5-325 MG PO TABS
1.0000 | ORAL_TABLET | ORAL | Status: DC | PRN
  Administered 2013-10-10 (×2): 1 via ORAL
  Filled 2013-10-09 (×3): qty 1

## 2013-10-09 MED ORDER — CITRIC ACID-SODIUM CITRATE 334-500 MG/5ML PO SOLN: 30.0000 mL | ORAL | Status: DC | PRN

## 2013-10-09 MED ORDER — LIDOCAINE HCL (PF) 1 % IJ SOLN
INTRAMUSCULAR | Status: DC | PRN
  Administered 2013-10-09 (×2): 5 mL

## 2013-10-09 MED ORDER — ONDANSETRON HCL 4 MG/2ML IJ SOLN
4.0000 mg | Freq: Four times a day (QID) | INTRAMUSCULAR | Status: DC | PRN
  Administered 2013-10-09: 4 mg via INTRAVENOUS
  Filled 2013-10-09: qty 2

## 2013-10-09 MED ORDER — PHENYLEPHRINE 40 MCG/ML (10ML) SYRINGE FOR IV PUSH (FOR BLOOD PRESSURE SUPPORT)
PREFILLED_SYRINGE | INTRAVENOUS | Status: AC
  Filled 2013-10-09: qty 10

## 2013-10-09 MED ORDER — SIMETHICONE 80 MG PO CHEW: 80.0000 mg | CHEWABLE_TABLET | ORAL | Status: DC | PRN

## 2013-10-09 MED ORDER — PRENATAL MULTIVITAMIN CH
1.0000 | ORAL_TABLET | Freq: Every day | ORAL | Status: DC
  Administered 2013-10-10: 1 via ORAL
  Filled 2013-10-09: qty 1

## 2013-10-09 MED ORDER — SENNOSIDES-DOCUSATE SODIUM 8.6-50 MG PO TABS
2.0000 | ORAL_TABLET | ORAL | Status: DC
  Administered 2013-10-10: 2 via ORAL
  Filled 2013-10-09: qty 2

## 2013-10-09 MED ORDER — DIPHENHYDRAMINE HCL 25 MG PO CAPS: 25.0000 mg | ORAL_CAPSULE | Freq: Four times a day (QID) | ORAL | Status: DC | PRN

## 2013-10-09 MED ORDER — IBUPROFEN 600 MG PO TABS
600.0000 mg | ORAL_TABLET | Freq: Four times a day (QID) | ORAL | Status: DC
  Administered 2013-10-09 – 2013-10-10 (×5): 600 mg via ORAL
  Filled 2013-10-09 (×5): qty 1

## 2013-10-09 MED ORDER — DIBUCAINE 1 % RE OINT: 1.0000 "application " | TOPICAL_OINTMENT | RECTAL | Status: DC | PRN

## 2013-10-09 NOTE — Anesthesia Preprocedure Evaluation (Signed)

## 2013-10-09 NOTE — Anesthesia Procedure Notes (Signed)
Epidural Patient location during procedure: OB Start time: 10/09/2013 9:20 AM  Staffing Anesthesiologist: Brayton CavesJACKSON, Angelgabriel Willmore Performed by: anesthesiologist   Preanesthetic Checklist Completed: patient identified, site marked, surgical consent, pre-op evaluation, timeout performed, IV checked, risks and benefits discussed and monitors and equipment checked  Epidural Patient position: sitting Prep: site prepped and draped and DuraPrep Patient monitoring: continuous pulse ox and blood pressure Approach: midline Location: L3-L4 Injection technique: LOR air  Needle:  Needle type: Tuohy  Needle gauge: 17 G Needle length: 9 cm and 9 Needle insertion depth: 5 cm cm Catheter type: closed end flexible Catheter size: 19 Gauge Catheter at skin depth: 10 cm Test dose: negative  Assessment Events: blood not aspirated, injection not painful, no injection resistance, negative IV test and no paresthesia  Additional Notes Patient identified.  Risk benefits discussed including failed block, incomplete pain control, headache, nerve damage, paralysis, blood pressure changes, nausea, vomiting, reactions to medication both toxic or allergic, and postpartum back pain.  Patient expressed understanding and wished to proceed.  All questions were answered.  Sterile technique used throughout procedure and epidural site dressed with sterile barrier dressing. No paresthesia or other complications noted.The patient did not experience any signs of intravascular injection such as tinnitus or metallic taste in mouth nor signs of intrathecal spread such as rapid motor block. Please see nursing notes for vital signs.

## 2013-10-09 NOTE — H&P (Signed)
Kimberly Brock is a [age] y.o. female, G4P3003 at 40.5 weeks  Patient Active Problem List   Diagnosis Date Noted  . Vaginal delivery 10/09/2013    Pregnancy Course: Patient entered care at 10.0 weeks.   EDC of 10/04/13 was established by EDD.   Anatomy scan:  21.0 weeks, with normal findings and an posterior placenta.   Additional US evaluations: n/a.   Significant prenatal events:  n/a   Last evaluation:  40.4 weeks   VE:1/50/-3 on 09/25/13  Reason for admission: labor  Pt States:     Contractions Frequency: 4-6     Contraction severity: moderate     Fetal activity: +FM   OB History   Grav Para Term Preterm Abortions TAB SAB Ect Mult Living   4 4 4       4      Past Medical History  Diagnosis Date  . Medical history non-contributory    Past Surgical History  Procedure Laterality Date  . No past surgeries     Family History: family history is negative for Cancer and Stroke. Social History:  reports that she has never smoked. She has never used smokeless tobacco. She reports that she does not drink alcohol or use illicit drugs.   Prenatal Transfer Tool  Maternal Diabetes: No Genetic Screening: Declined Maternal Ultrasounds/Referrals: n/a Fetal Ultrasounds or other Referrals:  None Maternal Substance Abuse:  No Significant Maternal Medications:  None Significant Maternal Lab Results: None   ROS:  See HPI above, all other systems are negative  No Known Allergies   SE 4-5/80 BBW  Blood pressure 100/55, pulse 84, temperature 98.2 F (36.8 C), temperature source Oral, resp. rate 18, height 5\' 6"  (1.676 m), weight 174 lb 3.2 oz (79.017 kg), SpO2 100.00%, unknown if currently breastfeeding.   Maternal Exam:  Uterine Assessment: q4-856min Abdomen: Gravid, non tender. Fundal height is aga.  External genitalia-female circumcision, vulva, cervix, uterus and adnexa.  No lesions noted on exam.  Pelvis adequate for delivery.  Fetal presentation: Vertex by exam   Fetal  Exam:  Fetal Monitor Surveillance : Continuous Monitoring Mode: Ultrasound.  FHR: Catagory  1 CTXs: Q 4-6 minutes EFW   8lbs   Physical Exam: Nursing note and vitals reviewed General: alert and cooperative She appears well nourished.  Psychiatric: Normal mood and affect. Her behavior is normal.  Head: Normocephalic.  Eyes: Pupils are equal, round, and reactive to light.  Neck: Normal range of motion.  Cardiovascular: RRR without murmur.  Respiratory: CTAB. Effort normal.  Abd: soft, non-tender, +BS, no rebound, no guarding  Genitourinary: Vagina normal.  Musculoskeletal: Normal range of motion.  Ext: WNL, No evidence of DVT seen on physical exam. Homan's sign negative bilaterally Minimal bilaterally non-pitting edema Neurological: A&Ox3.  Normal reflexes.  Skin: Warm and dry.    Prenatal labs: ABO, Rh: O/Positive/-- (12/05 0000) Antibody: Negative (12/05 0000) Rubella:   immune RPR: NON REAC (08/05 0800)  HBsAg: Negative (12/05 0000)  HIV:   negative GBS: Negative (07/09 0000) Sickle cell/Hgb electrophoresis:  WNL Pap:  Negative 3/14 GC:  *negative Chlamydia:  negative Genetic screenings:  declined Glucola:  wnl   Assessment IUP at 40.5 Weeks Membrane: intact GBS negative Desires Epidural   Plan: Admit to L&D for expectant management of labor. Possible augmentation options reviewed including foley bulb, AROM and/or pitocin.  Routine L&D orders IV pain medication per orders Epidural per patient request Foley cath after patient is comfortable with epidural Anticipate SVD    Miranda Frese,  CNM, MSN 10/09/2013, 9:12 PM

## 2013-10-09 NOTE — Progress Notes (Signed)
Explained benefits of skin to skin to parents--both refused although highly encouraged from RN--infant in blankets and placed in warmer--FOB present at warmer interacting with infant

## 2013-10-09 NOTE — MAU Note (Signed)
Pt was seen in the office yesterday.  Dilated to 2 cm per pt.  EDC:10-04-13.  U/C's started at 0200 this morning and are every 20 minutes now and getting stronger.  Denies vaginal bleeding or ROM.  Good fetal movement.

## 2013-10-10 LAB — CBC
HCT: 32 % — ABNORMAL LOW (ref 36.0–46.0)
Hemoglobin: 10.5 g/dL — ABNORMAL LOW (ref 12.0–15.0)
MCH: 29.6 pg (ref 26.0–34.0)
MCHC: 32.8 g/dL (ref 30.0–36.0)
MCV: 90.1 fL (ref 78.0–100.0)
Platelets: 231 10*3/uL (ref 150–400)
RBC: 3.55 MIL/uL — ABNORMAL LOW (ref 3.87–5.11)
RDW: 13.3 % (ref 11.5–15.5)
WBC: 10.6 10*3/uL — AB (ref 4.0–10.5)

## 2013-10-10 MED ORDER — FERROUS SULFATE 325 (65 FE) MG PO TABS: 325.0000 mg | ORAL_TABLET | Freq: Every day | ORAL | Status: DC

## 2013-10-10 MED ORDER — OXYCODONE-ACETAMINOPHEN 5-325 MG PO TABS: 1.0000 | ORAL_TABLET | ORAL | Status: DC | PRN

## 2013-10-10 MED ORDER — IBUPROFEN 600 MG PO TABS: 600.0000 mg | ORAL_TABLET | Freq: Four times a day (QID) | ORAL | Status: DC

## 2013-10-10 NOTE — Discharge Summary (Signed)
  Kimberly Brock    Subjective: Post Partum Day 1 Vaginal delivery, no laceration Patient up ad lib, denies syncope or dizziness. Reports consuming regular diet without issues and denies N/V No issues with urination and reports bleeding is appropriate  Feeding:  Best and bottle Contraceptive plan:   mirena IUD  Objective: Temp:  [98.2 F (36.8 C)-98.6 F (37 C)] 98.5 F (36.9 C) (08/06 0400) Pulse Rate:  [61-84] 69 (08/06 0400) Resp:  [16-18] 18 (08/06 0400) BP: (100-108)/(55-63) 101/63 mmHg (08/06 0400) SpO2:  [100 %] 100 % (08/05 1739)  Physical Exam:  General: alert and cooperative Ext: WNL, no edema. No evidence of DVT seen on physical exam. Breast: Soft  Lungs: CTAB Heart RRR without murmur  Abdomen:  Soft, fundus firm, lochia scant, + bowel sounds, non distended, non tender Lochia: appropriate Uterine Fundus: firm Laceration: n/a    Recent Labs  10/09/13 0800 10/10/13 0630  HGB 11.5* 10.5*  HCT 34.7* 32.0*    Assessment S/P Vaginal Delivery-Day 1 Stable  Normal Involution Breastfeeding/Bottlefeeding Circumcision: n/a  Plan: Continue current care Dr. Sallye OberKulwa updated on patient status  Discharge home and Contraception Mirena Rx: IBP, percocet, Fe, PNV     Shakeema Lippman, CNM, MSN 10/10/2013, 5:28 PM

## 2013-10-10 NOTE — Discharge Instructions (Signed)
Postpartum Depression and Baby Blues °The postpartum period begins right after the birth of a baby. During this time, there is often a great amount of joy and excitement. It is also a time of many changes in the life of the parents. Regardless of how many times a mother gives birth, each child brings new challenges and dynamics to the family. It is not unusual to have feelings of excitement along with confusing shifts in moods, emotions, and thoughts. All mothers are at risk of developing postpartum depression or the "baby blues." These mood changes can occur right after giving birth, or they may occur many months after giving birth. The baby blues or postpartum depression can be mild or severe. Additionally, postpartum depression can go away rather quickly, or it can be a long-term condition.  °CAUSES °Raised hormone levels and the rapid drop in those levels are thought to be a main cause of postpartum depression and the baby blues. A number of hormones change during and after pregnancy. Estrogen and progesterone usually decrease right after the delivery of your baby. The levels of thyroid hormone and various cortisol steroids also rapidly drop. Other factors that play a role in these mood changes include major life events and genetics.  °RISK FACTORS °If you have any of the following risks for the baby blues or postpartum depression, know what symptoms to watch out for during the postpartum period. Risk factors that may increase the likelihood of getting the baby blues or postpartum depression include: °· Having a personal or family history of depression.   °· Having depression while being pregnant.   °· Having premenstrual mood issues or mood issues related to oral contraceptives. °· Having a lot of life stress.   °· Having marital conflict.   °· Lacking a social support network.   °· Having a baby with special needs.   °· Having health problems, such as diabetes.   °SIGNS AND SYMPTOMS °Symptoms of baby blues  include: °· Brief changes in mood, such as going from extreme happiness to sadness. °· Decreased concentration.   °· Difficulty sleeping.   °· Crying spells, tearfulness.   °· Irritability.   °· Anxiety.   °Symptoms of postpartum depression typically begin within the first month after giving birth. These symptoms include: °· Difficulty sleeping or excessive sleepiness.   °· Marked weight loss.   °· Agitation.   °· Feelings of worthlessness.   °· Lack of interest in activity or food.   °Postpartum psychosis is a very serious condition and can be dangerous. Fortunately, it is rare. Displaying any of the following symptoms is cause for immediate medical attention. Symptoms of postpartum psychosis include:  °· Hallucinations and delusions.   °· Bizarre or disorganized behavior.   °· Confusion or disorientation.   °DIAGNOSIS  °A diagnosis is made by an evaluation of your symptoms. There are no medical or lab tests that lead to a diagnosis, but there are various questionnaires that a health care provider may use to identify those with the baby blues, postpartum depression, or psychosis. Often, a screening tool called the Edinburgh Postnatal Depression Scale is used to diagnose depression in the postpartum period.  °TREATMENT °The baby blues usually goes away on its own in 1-2 weeks. Social support is often all that is needed. You will be encouraged to get adequate sleep and rest. Occasionally, you may be given medicines to help you sleep.  °Postpartum depression requires treatment because it can last several months or longer if it is not treated. Treatment may include individual or group therapy, medicine, or both to address any social, physiological, and psychological   factors that may play a role in the depression. Regular exercise, a healthy diet, rest, and social support may also be strongly recommended.  °Postpartum psychosis is more serious and needs treatment right away. Hospitalization is often needed. °HOME CARE  INSTRUCTIONS °· Get as much rest as you can. Nap when the baby sleeps.   °· Exercise regularly. Some women find yoga and walking to be beneficial.   °· Eat a balanced and nourishing diet.   °· Do little things that you enjoy. Have a cup of tea, take a bubble bath, read your favorite magazine, or listen to your favorite music. °· Avoid alcohol.   °· Ask for help with household chores, cooking, grocery shopping, or running errands as needed. Do not try to do everything.   °· Talk to people close to you about how you are feeling. Get support from your partner, family members, friends, or other new moms. °· Try to stay positive in how you think. Think about the things you are grateful for.   °· Do not spend a lot of time alone.   °· Only take over-the-counter or prescription medicine as directed by your health care provider. °· Keep all your postpartum appointments.   °· Let your health care provider know if you have any concerns.   °SEEK MEDICAL CARE IF: °You are having a reaction to or problems with your medicine. °SEEK IMMEDIATE MEDICAL CARE IF: °· You have suicidal feelings.   °· You think you may harm the baby or someone else. °MAKE SURE YOU: °· Understand these instructions. °· Will watch your condition. °· Will get help right away if you are not doing well or get worse. °Document Released: 11/26/2003 Document Revised: 02/26/2013 Document Reviewed: 12/03/2012 °[employer]® Patient Information ©2015 [employer]. This information is not intended to replace advice given to you by your health care provider. Make sure you discuss any questions you have with your health care provider. °Breastfeeding °Deciding to breastfeed is one of the best choices you can make for you and your baby. A change in hormones during pregnancy causes your breast tissue to grow and increases the number and size of your milk ducts. These hormones also allow proteins, sugars, and fats from your blood supply to make breast milk in your  milk-producing glands. Hormones prevent breast milk from being released before your baby is born as well as prompt milk flow after birth. Once breastfeeding has begun, thoughts of your baby, as well as his or her sucking or crying, can stimulate the release of milk from your milk-producing glands.  °BENEFITS OF BREASTFEEDING °For Your Baby °· Your first milk (colostrum) helps your baby's digestive system function better.   °· There are antibodies in your milk that help your baby fight off infections.   °· Your baby has a lower incidence of asthma, allergies, and sudden infant death syndrome.   °· The nutrients in breast milk are better for your baby than infant formulas and are designed uniquely for your baby's needs.   °· Breast milk improves your baby's brain development.   °· Your baby is less likely to develop other conditions, such as childhood obesity, asthma, or type 2 diabetes mellitus.   °For You  °· Breastfeeding helps to create a very special bond between you and your baby.   °· Breastfeeding is convenient. Breast milk is always available at the correct temperature and costs nothing.   °· Breastfeeding helps to burn calories and helps you lose the weight gained during pregnancy.   °· Breastfeeding makes your uterus contract to its prepregnancy size faster and slows bleeding (  lochia) after you give birth.   °· Breastfeeding helps to lower your risk of developing type 2 diabetes mellitus, osteoporosis, and breast or ovarian cancer later in life. °SIGNS THAT YOUR BABY IS HUNGRY °Early Signs of Hunger  °· Increased alertness or activity. °· Stretching. °· Movement of the head from side to side. °· Movement of the head and opening of the mouth when the corner of the mouth or cheek is stroked (rooting). °· Increased sucking sounds, smacking lips, cooing, sighing, or squeaking. °· Hand-to-mouth movements. °· Increased sucking of fingers or hands. °Late Signs of Hunger °· Fussing. °· Intermittent crying. °Extreme  Signs of Hunger °Signs of extreme hunger will require calming and consoling before your baby will be able to breastfeed successfully. Do not wait for the following signs of extreme hunger to occur before you initiate breastfeeding:   °· Restlessness. °· A loud, strong cry. °·  Screaming. °BREASTFEEDING BASICS °Breastfeeding Initiation °· Find a comfortable place to sit or lie down, with your neck and back well supported. °· Place a pillow or rolled up blanket under your baby to bring him or her to the level of your breast (if you are seated). Nursing pillows are specially designed to help support your arms and your baby while you breastfeed. °· Make sure that your baby's abdomen is facing your abdomen.   °· Gently massage your breast. With your fingertips, massage from your chest wall toward your nipple in a circular motion. This encourages milk flow. You may need to continue this action during the feeding if your milk flows slowly. °· Support your breast with 4 fingers underneath and your thumb above your nipple. Make sure your fingers are well away from your nipple and your baby's mouth.   °· Stroke your baby's lips gently with your finger or nipple.   °· When your baby's mouth is open wide enough, quickly bring your baby to your breast, placing your entire nipple and as much of the colored area around your nipple (areola) as possible into your baby's mouth.   °¨ More areola should be visible above your baby's upper lip than below the lower lip.   °¨ Your baby's tongue should be between his or her lower gum and your breast.   °· Ensure that your baby's mouth is correctly positioned around your nipple (latched). Your baby's lips should create a seal on your breast and be turned out (everted). °· It is common for your baby to suck about 2-3 minutes in order to start the flow of breast milk. °Latching °Teaching your baby how to latch on to your breast properly is very important. An improper latch can cause nipple  pain and decreased milk supply for you and poor weight gain in your baby. Also, if your baby is not latched onto your nipple properly, he or she may swallow some air during feeding. This can make your baby fussy. Burping your baby when you switch breasts during the feeding can help to get rid of the air. However, teaching your baby to latch on properly is still the best way to prevent fussiness from swallowing air while breastfeeding. °Signs that your baby has successfully latched on to your nipple:    °· Silent tugging or silent sucking, without causing you pain.   °· Swallowing heard between every 3-4 sucks.   °·  Muscle movement above and in front of his or her ears while sucking.   °Signs that your baby has not successfully latched on to nipple:  °· Sucking sounds or smacking sounds from your baby while   breastfeeding. °· Nipple pain. °If you think your baby has not latched on correctly, slip your finger into the corner of your baby's mouth to break the suction and place it between your baby's gums. Attempt breastfeeding initiation again. °Signs of Successful Breastfeeding °Signs from your baby:   °· A gradual decrease in the number of sucks or complete cessation of sucking.   °· Falling asleep.   °· Relaxation of his or her body.   °· Retention of a small amount of milk in his or her mouth.   °· Letting go of your breast by himself or herself. °Signs from you: °· Breasts that have increased in firmness, weight, and size 1-3 hours after feeding.   °· Breasts that are softer immediately after breastfeeding. °· Increased milk volume, as well as a change in milk consistency and color by the fifth day of breastfeeding.   °· Nipples that are not sore, cracked, or bleeding. °Signs That Your Baby is Getting Enough Milk °· Wetting at least 3 diapers in a 24-hour period. The urine should be clear and pale yellow by age 5 days. °· At least 3 stools in a 24-hour period by age 5 days. The stool should be soft and  yellow. °· At least 3 stools in a 24-hour period by age 7 days. The stool should be seedy and yellow. °· No loss of weight greater than 10% of birth weight during the first 3 days of age. °· Average weight gain of 4-7 ounces (113-198 g) per week after age 4 days. °· Consistent daily weight gain by age 5 days, without weight loss after the age of 2 weeks. °After a feeding, your baby may spit up a small amount. This is common. °BREASTFEEDING FREQUENCY AND DURATION °Frequent feeding will help you make more milk and can prevent sore nipples and breast engorgement. Breastfeed when you feel the need to reduce the fullness of your breasts or when your baby shows signs of hunger. This is called "breastfeeding on demand." Avoid introducing a pacifier to your baby while you are working to establish breastfeeding (the first 4-6 weeks after your baby is born). After this time you may choose to use a pacifier. Research has shown that pacifier use during the first year of a baby's life decreases the risk of sudden infant death syndrome (SIDS). °Allow your baby to feed on each breast as long as he or she wants. Breastfeed until your baby is finished feeding. When your baby unlatches or falls asleep while feeding from the first breast, offer the second breast. Because newborns are often sleepy in the first few weeks of life, you may need to awaken your baby to get him or her to feed. °Breastfeeding times will vary from baby to baby. However, the following rules can serve as a guide to help you ensure that your baby is properly fed: °· Newborns (babies 4 weeks of age or younger) may breastfeed every 1-3 hours. °· Newborns should not go longer than 3 hours during the day or 5 hours during the night without breastfeeding. °· You should breastfeed your baby a minimum of 8 times in a 24-hour period until you begin to introduce solid foods to your baby at around 6 months of age. °BREAST MILK PUMPING °Pumping and storing breast milk  allows you to ensure that your baby is exclusively fed your breast milk, even at times when you are unable to breastfeed. This is especially important if you are going back to work while you are still breastfeeding or when   you are not able to be present during feedings. Your lactation consultant can give you guidelines on how long it is safe to store breast milk.  °A breast pump is a machine that allows you to pump milk from your breast into a sterile bottle. The pumped breast milk can then be stored in a refrigerator or freezer. Some breast pumps are operated by hand, while others use electricity. Ask your lactation consultant which type will work best for you. Breast pumps can be purchased, but some hospitals and breastfeeding support groups lease breast pumps on a monthly basis. A lactation consultant can teach you how to hand express breast milk, if you prefer not to use a pump.  °CARING FOR YOUR BREASTS WHILE YOU BREASTFEED °Nipples can become dry, cracked, and sore while breastfeeding. The following recommendations can help keep your breasts moisturized and healthy: °· Avoid using soap on your nipples.   °· Wear a supportive bra. Although not required, special nursing bras and tank tops are designed to allow access to your breasts for breastfeeding without taking off your entire bra or top. Avoid wearing underwire-style bras or extremely tight bras. °· Air dry your nipples for 3-4 minutes after each feeding.   °· Use only cotton bra pads to absorb leaked breast milk. Leaking of breast milk between feedings is normal.   °· Use lanolin on your nipples after breastfeeding. Lanolin helps to maintain your skin's normal moisture barrier. If you use pure lanolin, you do not need to wash it off before feeding your baby again. Pure lanolin is not toxic to your baby. You may also hand express a few drops of breast milk and gently massage that milk into your nipples and allow the milk to air dry. °In the first few weeks  after giving birth, some women experience extremely full breasts (engorgement). Engorgement can make your breasts feel heavy, warm, and tender to the touch. Engorgement peaks within 3-5 days after you give birth. The following recommendations can help ease engorgement: °· Completely empty your breasts while breastfeeding or pumping. You may want to start by applying warm, moist heat (in the shower or with warm water-soaked hand towels) just before feeding or pumping. This increases circulation and helps the milk flow. If your baby does not completely empty your breasts while breastfeeding, pump any extra milk after he or she is finished. °· Wear a snug bra (nursing or regular) or tank top for 1-2 days to signal your body to slightly decrease milk production. °· Apply ice packs to your breasts, unless this is too uncomfortable for you. °· Make sure that your baby is latched on and positioned properly while breastfeeding. °If engorgement persists after 48 hours of following these recommendations, contact your health care provider or a lactation consultant. °OVERALL HEALTH CARE RECOMMENDATIONS WHILE BREASTFEEDING °· Eat healthy foods. Alternate between meals and snacks, eating 3 of each per day. Because what you eat affects your breast milk, some of the foods may make your baby more irritable than usual. Avoid eating these foods if you are sure that they are negatively affecting your baby. °· Drink milk, fruit juice, and water to satisfy your thirst (about 10 glasses a day).   °· Rest often, relax, and continue to take your prenatal vitamins to prevent fatigue, stress, and anemia. °· Continue breast self-awareness checks. °· Avoid chewing and smoking tobacco. °· Avoid alcohol and drug use. °Some medicines that may be harmful to your baby can pass through breast milk. It is important to ask your   health care provider before taking any medicine, including all over-the-counter and prescription medicine as well as vitamin  and herbal supplements. °It is possible to become pregnant while breastfeeding. If birth control is desired, ask your health care provider about options that will be safe for your baby. °SEEK MEDICAL CARE IF:  °· You feel like you want to stop breastfeeding or have become frustrated with breastfeeding. °· You have painful breasts or nipples. °· Your nipples are cracked or bleeding. °· Your breasts are red, tender, or warm. °· You have a swollen area on either breast. °· You have a fever or chills. °· You have nausea or vomiting. °· You have drainage other than breast milk from your nipples. °· Your breasts do not become full before feedings by the fifth day after you give birth. °· You feel sad and depressed. °· Your baby is too sleepy to eat well. °· Your baby is having trouble sleeping.   °· Your baby is wetting less than 3 diapers in a 24-hour period. °· Your baby has less than 3 stools in a 24-hour period. °· Your baby's skin or the white part of his or her eyes becomes yellow.   °· Your baby is not gaining weight by 5 days of age. °SEEK IMMEDIATE MEDICAL CARE IF:  °· Your baby is overly tired (lethargic) and does not want to wake up and feed. °· Your baby develops an unexplained fever. °Document Released: 02/21/2005 Document Revised: 02/26/2013 Document Reviewed: 08/15/2012 °[employer]® Patient Information ©2015 [employer]. This information is not intended to replace advice given to you by your health care provider. Make sure you discuss any questions you have with your health care provider. °Vaginal Delivery, Care After °Refer to this sheet in the next few weeks. These discharge instructions provide you with information on caring for yourself after delivery. Your caregiver may also give you specific instructions. Your treatment has been planned according to the most current medical practices available, but problems sometimes occur. Call your caregiver if you have any problems or questions after you go  home. °HOME CARE INSTRUCTIONS °· Take over-the-counter or prescription medicines only as directed by your caregiver or pharmacist. °· Do not drink alcohol, especially if you are breastfeeding or taking medicine to relieve pain. °· Do not chew or smoke tobacco. °· Do not use illegal drugs. °· Continue to use good perineal care. Good perineal care includes: °¨ Wiping your perineum from front to back. °¨ Keeping your perineum clean. °· Do not use tampons or douche until your caregiver says it is okay. °· Shower, wash your hair, and take tub baths as directed by your caregiver. °· Wear a well-fitting bra that provides breast support. °· Eat healthy foods. °· Drink enough fluids to keep your urine clear or pale yellow. °· Eat high-fiber foods such as whole grain cereals and breads, brown rice, beans, and fresh fruits and vegetables every day. These foods may help prevent or relieve constipation. °· Follow your caregiver's recommendations regarding resumption of activities such as climbing stairs, driving, lifting, exercising, or traveling. °· Talk to your caregiver about resuming sexual activities. Resumption of sexual activities is dependent upon your risk of infection, your rate of healing, and your comfort and desire to resume sexual activity. °· Try to have someone help you with your household activities and your newborn for at least a few days after you leave the hospital. °· Rest as much as possible. Try to rest or take a nap when your newborn is   sleeping. °· Increase your activities gradually. °· Keep all of your scheduled postpartum appointments. It is very important to keep your scheduled follow-up appointments. At these appointments, your caregiver will be checking to make sure that you are healing physically and emotionally. °SEEK MEDICAL CARE IF:  °· You are passing large clots from your vagina. Save any clots to show your caregiver. °· You have a foul smelling discharge from your vagina. °· You have trouble  urinating. °· You are urinating frequently. °· You have pain when you urinate. °· You have a change in your bowel movements. °· You have increasing redness, pain, or swelling near your vaginal incision (episiotomy) or vaginal tear. °· You have pus draining from your episiotomy or vaginal tear. °· Your episiotomy or vaginal tear is separating. °· You have painful, hard, or reddened breasts. °· You have a severe headache. °· You have blurred vision or see spots. °· You feel sad or depressed. °· You have thoughts of hurting yourself or your newborn. °· You have questions about your care, the care of your newborn, or medicines. °· You are dizzy or light-headed. °· You have a rash. °· You have nausea or vomiting. °· You were breastfeeding and have not had a menstrual period within 12 weeks after you stopped breastfeeding. °· You are not breastfeeding and have not had a menstrual period by the 12th week after delivery. °· You have a fever. °SEEK IMMEDIATE MEDICAL CARE IF:  °· You have persistent pain. °· You have chest pain. °· You have shortness of breath. °· You faint. °· You have leg pain. °· You have stomach pain. °· Your vaginal bleeding saturates two or more sanitary pads in 1 hour. °MAKE SURE YOU:  °· Understand these instructions. °· Will watch your condition. °· Will get help right away if you are not doing well or get worse. °Document Released: 02/19/2000 Document Revised: 07/08/2013 Document Reviewed: 10/19/2011 °[employer]® Patient Information ©2015 [employer]. This information is not intended to replace advice given to you by your health care provider. Make sure you discuss any questions you have with your health care provider. ° °

## 2013-10-10 NOTE — Anesthesia Postprocedure Evaluation (Signed)
  Anesthesia Post-op Note  Anesthesia Post Note  Patient: Kimberly Brock  Procedure(s) Performed: * No procedures listed *  Anesthesia type: Epidural  Patient location: Mother/Baby  Post pain: Pain level controlled  Post assessment: Post-op Vital signs reviewed  Last Vitals:  Filed Vitals:   10/10/13 0400  BP: 101/63  Pulse: 69  Temp: 36.9 C  Resp: 18    Post vital signs: Reviewed  Level of consciousness:alert  Complications: No apparent anesthesia complications

## 2013-10-10 NOTE — Progress Notes (Addendum)
Kimberly Brock    Subjective: Post Partum Day 1 Vaginal delivery, no laceration Patient up ad lib, denies syncope or dizziness. Reports consuming regular diet without issues and denies N/V No issues with urination and reports bleeding is appropriate  Feeding:  Breast and bottle  Contraceptive plan:   Mirena  Objective: Temp:  [98.1 F (36.7 C)-98.9 F (37.2 C)] 98.5 F (36.9 C) (08/06 0400) Pulse Rate:  [51-150] 69 (08/06 0400) Resp:  [16-18] 18 (08/06 0400) BP: (81-117)/(48-81) 101/63 mmHg (08/06 0400) SpO2:  [100 %] 100 % (08/05 1739)  Physical Exam:  General: alert and cooperative Ext: WNL, no edema. No evidence of DVT seen on physical exam. Breast: Soft  Lungs: CTAB Heart RRR without murmur  Abdomen:  Soft, fundus firm, lochia scant, + bowel sounds, non distended, non tender Lochia: appropriate Uterine Fundus: firm Laceration: n/a    Recent Labs  10/09/13 0800 10/10/13 0630  HGB 11.5* 10.5*  HCT 34.7* 32.0*    Assessment S/P Vaginal Delivery-Day 1 Stable  Normal Involution Breastfeeding/Bottlefeeding Circumcision: n/a  Plan: Continue current care Dr. Sallye OberKulwa updated on patient status  Plan for discharge tomorrow and Contraception Mirena Lactation support   Kimberly Brock, CNM, MSN 10/10/2013, 8:24 AM

## 2014-01-06 ENCOUNTER — Encounter (HOSPITAL_COMMUNITY): Payer: Self-pay | Admitting: *Deleted

## 2014-03-24 ENCOUNTER — Other Ambulatory Visit (INDEPENDENT_AMBULATORY_CARE_PROVIDER_SITE_OTHER): Payer: 59

## 2014-03-24 ENCOUNTER — Ambulatory Visit (INDEPENDENT_AMBULATORY_CARE_PROVIDER_SITE_OTHER): Payer: 59 | Admitting: Internal Medicine

## 2014-03-24 ENCOUNTER — Encounter: Payer: Self-pay | Admitting: Internal Medicine

## 2014-03-24 VITALS — BP 112/76 | HR 74 | Temp 98.3°F | Ht 66.0 in | Wt 173.5 lb

## 2014-03-24 DIAGNOSIS — Z0000 Encounter for general adult medical examination without abnormal findings: Secondary | ICD-10-CM

## 2014-03-24 DIAGNOSIS — D509 Iron deficiency anemia, unspecified: Secondary | ICD-10-CM

## 2014-03-24 LAB — CBC
HCT: 40.4 % (ref 36.0–46.0)
Hemoglobin: 13.3 g/dL (ref 12.0–15.0)
MCHC: 32.9 g/dL (ref 30.0–36.0)
MCV: 91.1 fl (ref 78.0–100.0)
PLATELETS: 295 10*3/uL (ref 150.0–400.0)
RBC: 4.43 Mil/uL (ref 3.87–5.11)
RDW: 12.6 % (ref 11.5–15.5)
WBC: 7.9 10*3/uL (ref 4.0–10.5)

## 2014-03-24 LAB — BASIC METABOLIC PANEL
BUN: 14 mg/dL (ref 6–23)
CALCIUM: 9.5 mg/dL (ref 8.4–10.5)
CO2: 29 meq/L (ref 19–32)
CREATININE: 0.57 mg/dL (ref 0.40–1.20)
Chloride: 104 mEq/L (ref 96–112)
GFR: 129.97 mL/min (ref >60.00)
Glucose, Bld: 86 mg/dL (ref 70–99)
POTASSIUM: 3.7 meq/L (ref 3.5–5.1)
Sodium: 137 mEq/L (ref 135–145)

## 2014-03-24 NOTE — Progress Notes (Signed)
   Subjective:    Patient ID: Kimberly SolianNabila Brock, female    DOB: May 20, 1980, 34 y.o.   MRN: [id number]  HPI The patient is a 34 YO female who comes in today to establish care with her spouse and 145 month old daughter. She has PMH of iron deficiency anemia. She is able to understand [place] but also speaks Arabic. She does not have any complaints today. She denies chest pains, SOB, abdominal pains. She does notice some constipation since she is taking the iron pills. Denies joint pain or swelling. She is having fractured sleep with the baby but otherwise no problems. Denies low mood or depression or anxiety.   Review of Systems  Constitutional: Negative for fever, activity change, appetite change, fatigue and unexpected weight change.  HENT: Negative.   Respiratory: Negative for cough, chest tightness, shortness of breath and wheezing.   Gastrointestinal: Positive for constipation. Negative for nausea, abdominal pain, diarrhea, blood in stool and abdominal distention.  Genitourinary: Negative.   Musculoskeletal: Negative.   Skin: Negative.   Neurological: Negative.   Psychiatric/Behavioral: Negative.       Objective:   Physical Exam  Constitutional: She is oriented to person, place, and time. She appears well-developed and well-nourished.  Wears headwrap  HENT:  Head: Normocephalic and atraumatic.  Eyes: EOM are normal.  Neck: Normal range of motion.  Cardiovascular: Normal rate and regular rhythm.   Pulmonary/Chest: Effort normal and breath sounds normal. No respiratory distress. She has no wheezes. She has no rales.  Abdominal: Soft. Bowel sounds are normal. She exhibits no distension. There is no tenderness. There is no rebound.  Musculoskeletal: She exhibits no edema.  Neurological: She is alert and oriented to person, place, and time. Coordination normal.  Skin: Skin is warm and dry.   Filed Vitals:   03/24/14 1408  BP: 112/76  Pulse: 74  Temp: 98.3 F (36.8 C)  TempSrc: Oral    Height: 5\' 6"  (1.676 m)  Weight: 173 lb 8 oz (78.699 kg)  SpO2: 99%      Assessment & Plan:

## 2014-03-24 NOTE — Assessment & Plan Note (Signed)
Taking iron and likely cause of mild constipation. Will recheck CBC. If normal can stop iron, if still low will continue and advise docusate.

## 2014-03-24 NOTE — Assessment & Plan Note (Signed)
Check CBC and BMP. Last lipid panel normal and next recommended 2 years from now. Uptodate on pap smear, tetanus, flu shot.

## 2014-03-24 NOTE — Patient Instructions (Signed)
      .          .           .              .         1-2 .                     .   We will check your blood today for the levels. If the levels are good you can stop taking the iron pills. If the levels are still not normal you can keep taking the iron. For the constipation you can use docusate once a day to help it be easier to go.   We will see you back every 1-2 years. If you have any new problems please call our office and we can always see you sooner if needed.

## 2014-03-24 NOTE — Progress Notes (Signed)
Pre visit review using our clinic review tool, if applicable. No additional management support is needed unless otherwise documented below in the visit note. 

## 2014-06-04 ENCOUNTER — Encounter: Payer: Self-pay | Admitting: Internal Medicine

## 2014-06-04 ENCOUNTER — Other Ambulatory Visit (INDEPENDENT_AMBULATORY_CARE_PROVIDER_SITE_OTHER): Payer: 59

## 2014-06-04 ENCOUNTER — Ambulatory Visit (INDEPENDENT_AMBULATORY_CARE_PROVIDER_SITE_OTHER): Payer: 59 | Admitting: Internal Medicine

## 2014-06-04 VITALS — BP 104/80 | HR 65 | Temp 98.5°F | Ht 66.0 in | Wt 175.2 lb

## 2014-06-04 DIAGNOSIS — M25462 Effusion, left knee: Secondary | ICD-10-CM | POA: Diagnosis not present

## 2014-06-04 DIAGNOSIS — M25562 Pain in left knee: Secondary | ICD-10-CM

## 2014-06-04 DIAGNOSIS — M25561 Pain in right knee: Secondary | ICD-10-CM

## 2014-06-04 LAB — RHEUMATOID FACTOR

## 2014-06-04 LAB — SEDIMENTATION RATE: Sed Rate: 14 mm/hr (ref 0–22)

## 2014-06-04 NOTE — Progress Notes (Signed)
Pre visit review using our clinic review tool, if applicable. No additional management support is needed unless otherwise documented below in the visit note. 

## 2014-06-04 NOTE — Patient Instructions (Signed)
Consider glucosamine sulfate 1500 mg daily for joint symptoms. Take this daily  for 4 weeks. This will rehydrate the cartilages. Use an anti-inflammatory cream such as Aspercreme or Zostrix cream twice a day to the affected area as needed. In lieu of this warm moist compresses or  hot water bottle can be used. Do not apply ice .  Please take Aleve one to 2 every 8-12 hours with food as needed for the  pain.   Your next office appointment will be determined based upon review of your pending labs .  Those instructions will be transmitted to you by My Chart OR by mail for your records.  Critical results will be called.   Followup as needed for any active or acute issue. Please report any significant change in your symptoms.

## 2014-06-04 NOTE — Progress Notes (Signed)
   Subjective:    Patient ID: Kimberly SolianNabila Brock, female    DOB: September 16, 1980, 34 y.o.   MRN: [id number]  HPI  She describes pain in both knees for 3 weeks. There was no trigger or injury for this. The pain is actually present only when she climbs stairs. There's been slight swelling, mainly on the left.  She has absolutely no pain in any other joints.  She has not treated this in any form or fashion. There is no past history or family history of arthritis.   Review of Systems She denies fever, chills, sweats, weight loss  She's had no rash or change in color or temperature of skin.  There is no numbness, tingling, weakness of lower extremities.    Objective:   Physical Exam   Pertinent or positive findings include: There is slight ballotability of the patella bilaterally, greater on the left than the right. She has full range of motion without pain or discomfort.    General appearance :adequately nourished; in no distress. Eyes: No conjunctival inflammation or scleral icterus is present. Oral exam:  Lips and gums are healthy appearing.There is no oropharyngeal erythema or exudate noted. Dental hygiene is good. Heart:  Normal rate and regular rhythm. S1 and S2 normal without gallop, murmur, click, rub or other extra sounds   Lungs:Chest clear to auscultation; no wheezes, rhonchi,rales ,or rubs present.No increased work of breathing.  Abdomen: bowel sounds normal, soft and non-tender without masses, organomegaly or hernias noted.  No guarding or rebound.  Vascular : all pulses equal ; no bruits present. Skin:Warm & dry.  Intact without suspicious lesions or rashes ; no tenting Lymphatic: No lymphadenopathy is noted about the head, neck, axilla  Neuro: Strength, tone & DTRs normal.        Assessment & Plan:  #1 bilateral knee pain with suggestion of effusion clinically  Plan: See orders and recommendations

## 2015-06-22 ENCOUNTER — Telehealth: Payer: Self-pay

## 2015-06-22 ENCOUNTER — Ambulatory Visit (INDEPENDENT_AMBULATORY_CARE_PROVIDER_SITE_OTHER): Payer: 59 | Admitting: Physician Assistant

## 2015-06-22 ENCOUNTER — Ambulatory Visit (INDEPENDENT_AMBULATORY_CARE_PROVIDER_SITE_OTHER): Payer: 59

## 2015-06-22 VITALS — BP 122/72 | HR 95 | Temp 98.0°F | Resp 17 | Ht 68.0 in | Wt 164.0 lb

## 2015-06-22 DIAGNOSIS — M25562 Pain in left knee: Secondary | ICD-10-CM

## 2015-06-22 DIAGNOSIS — M25462 Effusion, left knee: Secondary | ICD-10-CM | POA: Diagnosis not present

## 2015-06-22 NOTE — Progress Notes (Signed)
[facility] [address] [place] [address] [phone]  Date:  06/22/2015   Name:  Kimberly Brock   DOB:  [birth year]   MRN:  [id number]  PCP:  Myrlene Broker, MD    History of Present Illness:  Kimberly Brock is a 35 y.o. female patient who presents to [facility] for cc of left knee pain.    Last Friday, worse pain  She wakes up and fine, but during the day swelling starts and gets worse..  Pain at the left sided.  smetimes in the back of the kneee.  She feels it all around.  She has never injured this knee before.  Last year she had pain on both.  No heat.  No numbness or tingling.  Exercises in the house, but not excessively.  She currently does not work, and cares for her daughter.  No trauma.  No hx of gout.  No high ingestion of red meats or shellfish.       Patient Active Problem List   Diagnosis Date Noted  . Routine general medical examination at a health care facility 03/24/2014  . Anemia, iron deficiency 03/24/2014  . 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 Language barrier to communication 10/09/2013    Past Medical History  Diagnosis Date  . Medical history non-contributory     Past Surgical History  Procedure Laterality Date  . No past surgeries      Social History  Substance Use Topics  . Smoking status: Never Smoker   . Smokeless tobacco: Never Used  . Alcohol Use: No    Family History  Problem Relation Age of Onset  . Cancer Neg Hx   . Stroke Neg Hx     No Known Allergies  Medication list has been reviewed and updated.  No current outpatient prescriptions on file prior to visit.   No current facility-administered medications on file prior to visit.    ROS ROS otherwise unremarkable unless listed above.   Physical Examination: BP 122/72 mmHg  Pulse 95  Temp(Src) 98 F (36.7 C) (Oral)  Resp 17  Ht  (1.727 m)  Wt 164 lb (74.39 kg)  BMI 24.94 kg/m2  SpO2 98%  LMP 06/06/2015  Breastfeeding? No Ideal Body  Weight: Weight in (lb) to have BMI = 25: 164.1  Physical Exam  Constitutional: She is oriented to person, place, and time. She appears well-developed and well-nourished. No distress.  HENT:  Head: Normocephalic and atraumatic.  Right Ear: External ear normal.  Left Ear: External ear normal.  Eyes: Conjunctivae and EOM are normal. Pupils are equal, round, and reactive to light.  Cardiovascular: Normal rate.   Pulmonary/Chest: Effort normal. No respiratory distress.  Musculoskeletal:       Left knee: She exhibits normal range of motion, no ecchymosis, no erythema, no LCL laxity, normal meniscus and no MCL laxity. Tenderness found. No medial joint line, no lateral joint line and no patellar tendon tenderness noted.  Left knee swelling diffusely minimal.  No warmth to the touch.  rom minimally limited.  There is no palpable effusion.    Neurological: She is alert and oriented to person, place, and time.  Skin: She is not diaphoretic.  Psychiatric: She has a normal mood and affect. Her behavior is normal.   Dg Knee Complete 4 Views Left  06/22/2015  CLINICAL DATA:  Left knee pain and swelling for 4 days. No known injury. EXAM: LEFT KNEE - COMPLETE 4+ VIEW COMPARISON:  None.  FINDINGS: There is a prominent knee joint effusion. Small osteophytes in the medial and patellofemoral compartments. No fracture or dislocation. IMPRESSION: Prominent joint effusion.  Minimal degenerative arthritic changes. Electronically Signed   By: Francene BoyersJames  Maxwell M.D.   On: 06/22/2015 11:07    Assessment and Plan: [name] is a 35 y.o. female who is here today for cc of left knee pain.   --swelling, advised knee brace use.  If this does not resolve, ortho consult may be advised for drainage of this effusion.   Left knee pain - Plan: DG Knee Complete 4 Views Left  Knee effusion, left   [name], PA-C [facility] 06/22/2015 10:12 AM

## 2015-06-22 NOTE — Patient Instructions (Addendum)
IF you received an x-ray today, you will receive an invoice from [facility]. Please contact [facility] at [phone] with questions or concerns regarding your invoice.   IF you received labwork today, you will receive an invoice from [facility]. Please contact [facility] at [phone] with questions or concerns regarding your invoice.   Our billing staff will not be able to assist you with questions regarding bills from these companies.  You will be contacted with the lab results as soon as they are available. The fastest way to get your results is to activate your My Chart account. Instructions are located on the last page of this paperwork. If you have not heard from us regarding the results in 2 weeks, please contact this office.   Ice the knee three times per day.  I would like you to perform these stretches daily.  i will give you mobic.  Do not take this with naproxen or ibuprofen.  You may use tylenol.   Please let me know if this does not improve.  We will then pursue an MRI.  Generic Knee Exercises EXERCISES RANGE OF MOTION (ROM) AND STRETCHING EXERCISES These exercises may help you when beginning to rehabilitate your injury. Your symptoms may resolve with or without further involvement from your physician, physical therapist, or athletic trainer. While completing these exercises, remember:   Restoring tissue flexibility helps normal motion to return to the joints. This allows healthier, less painful movement and activity.  An effective stretch should be held for at least 30 seconds.  A stretch should never be painful. You should only feel a gentle lengthening or release in the stretched tissue. STRETCH - Knee Extension, Prone  Lie on your stomach on a firm surface, such as a bed or countertop. Place your right / left knee and leg just beyond the edge of the surface. You may wish to place a towel under the far end of your right /  left thigh for comfort.  Relax your leg muscles and allow gravity to straighten your knee. Your clinician may advise you to add an ankle weight if more resistance is helpful for you.  You should feel a stretch in the back of your right / left knee. Hold this position for __________ seconds. Repeat __________ times. Complete this stretch __________ times per day. * Your physician, physical therapist, or athletic trainer may ask you to add ankle weight to enhance your stretch.  RANGE OF MOTION - Knee Flexion, Active  Lie on your back with both knees straight. (If this causes back discomfort, bend your opposite knee, placing your foot flat on the floor.)  Slowly slide your heel back toward your buttocks until you feel a gentle stretch in the front of your knee or thigh.  Hold for __________ seconds. Slowly slide your heel back to the starting position. Repeat __________ times. Complete this exercise __________ times per day.  STRETCH - Quadriceps, Prone   Lie on your stomach on a firm surface, such as a bed or padded floor.  Bend your right / left knee and grasp your ankle. If you are unable to reach your ankle or pant leg, use a belt around your foot to lengthen your reach.  Gently pull your heel toward your buttocks. Your knee should not slide out to the side. You should feel a stretch in the front of your thigh and/or knee.  Hold this position for __________ seconds. Repeat __________ times. Complete this stretch __________ times  per day.  STRETCH - Hamstrings, Supine   Lie on your back. Loop a belt or towel over the ball of your right / left foot.  Straighten your right / left knee and slowly pull on the belt to raise your leg. Do not allow the right / left knee to bend. Keep your opposite leg flat on the floor.  Raise the leg until you feel a gentle stretch behind your right / left knee or thigh. Hold this position for __________ seconds. Repeat __________ times. Complete this  stretch __________ times per day.  STRENGTHENING EXERCISES These exercises may help you when beginning to rehabilitate your injury. They may resolve your symptoms with or without further involvement from your physician, physical therapist, or athletic trainer. While completing these exercises, remember:   Muscles can gain both the endurance and the strength needed for everyday activities through controlled exercises.  Complete these exercises as instructed by your physician, physical therapist, or athletic trainer. Progress the resistance and repetitions only as guided.  You may experience muscle soreness or fatigue, but the pain or discomfort you are trying to eliminate should never worsen during these exercises. If this pain does worsen, stop and make certain you are following the directions exactly. If the pain is still present after adjustments, discontinue the exercise until you can discuss the trouble with your clinician. STRENGTH - Quadriceps, Isometrics  Lie on your back with your right / left leg extended and your opposite knee bent.  Gradually tense the muscles in the front of your right / left thigh. You should see either your knee cap slide up toward your hip or increased dimpling just above the knee. This motion will push the back of the knee down toward the floor/mat/bed on which you are lying.  Hold the muscle as tight as you can without increasing your pain for __________ seconds.  Relax the muscles slowly and completely in between each repetition. Repeat __________ times. Complete this exercise __________ times per day.  STRENGTH - Quadriceps, Short Arcs   Lie on your back. Place a __________ inch towel roll under your knee so that the knee slightly bends.  Raise only your lower leg by tightening the muscles in the front of your thigh. Do not allow your thigh to rise.  Hold this position for __________ seconds. Repeat __________ times. Complete this exercise __________  times per day.  OPTIONAL ANKLE WEIGHTS: Begin with ____________________, but DO NOT exceed ____________________. Increase in 1 pound/0.5 kilogram increments.  STRENGTH - Quadriceps, Straight Leg Raises  Quality counts! Watch for signs that the quadriceps muscle is working to insure you are strengthening the correct muscles and not "cheating" by substituting with healthier muscles.  Lay on your back with your right / left leg extended and your opposite knee bent.  Tense the muscles in the front of your right / left thigh. You should see either your knee cap slide up or increased dimpling just above the knee. Your thigh may even quiver.  Tighten these muscles even more and raise your leg 4 to 6 inches off the floor. Hold for __________ seconds.  Keeping these muscles tense, lower your leg.  Relax the muscles slowly and completely in between each repetition. Repeat __________ times. Complete this exercise __________ times per day.  STRENGTH - Hamstring, Curls  Lay on your stomach with your legs extended. (If you lay on a bed, your feet may hang over the edge.)  Tighten the muscles in the back of your  thigh to bend your right / left knee up to 90 degrees. Keep your hips flat on the bed/floor.  Hold this position for __________ seconds.  Slowly lower your leg back to the starting position. Repeat __________ times. Complete this exercise __________ times per day.  OPTIONAL ANKLE WEIGHTS: Begin with ____________________, but DO NOT exceed ____________________. Increase in 1 pound/0.5 kilogram increments.  STRENGTH - Quadriceps, Squats  Stand in a door frame so that your feet and knees are in line with the frame.  Use your hands for balance, not support, on the frame.  Slowly lower your weight, bending at the hips and knees. Keep your lower legs upright so that they are parallel with the door frame. Squat only within the range that does not increase your knee pain. Never let your hips drop  below your knees.  Slowly return upright, pushing with your legs, not pulling with your hands. Repeat __________ times. Complete this exercise __________ times per day.  STRENGTH - Quadriceps, Wall Slides  Follow guidelines for form closely. Increased knee pain often results from poorly placed feet or knees.  Lean against a smooth wall or door and walk your feet out 18-24 inches. Place your feet hip-width apart.  Slowly slide down the wall or door until your knees bend __________ degrees.* Keep your knees over your heels, not your toes, and in line with your hips, not falling to either side.  Hold for __________ seconds. Stand up to rest for __________ seconds in between each repetition. Repeat __________ times. Complete this exercise __________ times per day. * Your physician, physical therapist, or athletic trainer will alter this angle based on your symptoms and progress.   This information is not intended to replace advice given to you by your health care provider. Make sure you discuss any questions you have with your health care provider.   Document Released: 01/05/2005 Document Revised: 03/14/2014 Document Reviewed: 06/05/2008 Elsevier Interactive Patient Education [employer]

## 2015-06-22 NOTE — Telephone Encounter (Signed)
Pt states they were just seen and the medication has not been called in to [facility]  Best number [phone]

## 2015-06-23 MED ORDER — MELOXICAM 15 MG PO TABS: 15.0000 mg | ORAL_TABLET | Freq: Every day | ORAL | Status: DC

## 2015-06-23 NOTE — Telephone Encounter (Signed)
Sent to pharmacy this morning around 8am.  Please alert patient.

## 2015-07-02 ENCOUNTER — Ambulatory Visit (INDEPENDENT_AMBULATORY_CARE_PROVIDER_SITE_OTHER): Payer: 59 | Admitting: Medical

## 2015-07-02 ENCOUNTER — Encounter: Payer: Self-pay | Admitting: Medical

## 2015-07-02 VITALS — BP 100/68 | HR 60 | Wt 166.0 lb

## 2015-07-02 DIAGNOSIS — Z789 Other specified health status: Secondary | ICD-10-CM

## 2015-07-02 DIAGNOSIS — R3129 Other microscopic hematuria: Secondary | ICD-10-CM | POA: Diagnosis not present

## 2015-07-02 DIAGNOSIS — Z0000 Encounter for general adult medical examination without abnormal findings: Secondary | ICD-10-CM | POA: Diagnosis not present

## 2015-07-02 DIAGNOSIS — N90810 Female genital mutilation status, unspecified: Secondary | ICD-10-CM | POA: Diagnosis not present

## 2015-07-02 DIAGNOSIS — D509 Iron deficiency anemia, unspecified: Secondary | ICD-10-CM | POA: Diagnosis not present

## 2015-07-02 LAB — POCT URINALYSIS DIPSTICK
Bilirubin, UA: NEGATIVE
Glucose, UA: NEGATIVE
KETONES UA: NEGATIVE
Leukocytes, UA: NEGATIVE
Nitrite, UA: NEGATIVE
PROTEIN UA: NEGATIVE
SPEC GRAV UA: 1.02
Urobilinogen, UA: NEGATIVE
pH, UA: 6

## 2015-07-02 LAB — TSH: TSH: 0.45 m[IU]/L

## 2015-07-02 LAB — COMPREHENSIVE METABOLIC PANEL
ALK PHOS: 84 U/L (ref 33–115)
ALT: 16 U/L (ref 6–29)
AST: 12 U/L (ref 10–30)
Albumin: 4.5 g/dL (ref 3.6–5.1)
BUN: 9 mg/dL (ref 7–25)
CHLORIDE: 104 mmol/L (ref 98–110)
CO2: 27 mmol/L (ref 20–31)
Calcium: 9.7 mg/dL (ref 8.6–10.2)
Creat: 0.59 mg/dL (ref 0.50–1.10)
GLUCOSE: 95 mg/dL (ref 65–99)
POTASSIUM: 4.4 mmol/L (ref 3.5–5.3)
Sodium: 139 mmol/L (ref 135–146)
Total Bilirubin: 0.3 mg/dL (ref 0.2–1.2)
Total Protein: 7.1 g/dL (ref 6.1–8.1)

## 2015-07-02 LAB — LIPID PANEL
Cholesterol: 135 mg/dL (ref 125–200)
HDL: 57 mg/dL (ref >=46)
LDL Cholesterol: 37 mg/dL (ref <130)
Total CHOL/HDL Ratio: 2.4 Ratio (ref <=5.0)
Triglycerides: 205 mg/dL — ABNORMAL HIGH (ref <150)
VLDL: 41 mg/dL — ABNORMAL HIGH (ref <30)

## 2015-07-02 LAB — CBC
HEMATOCRIT: 39.6 % (ref 35.0–45.0)
Hemoglobin: 13 g/dL (ref 11.7–15.5)
MCH: 30.2 pg (ref 27.0–33.0)
MCHC: 32.8 g/dL (ref 32.0–36.0)
MCV: 92.1 fL (ref 80.0–100.0)
MPV: 10.4 fL (ref 7.5–12.5)
PLATELETS: 337 10*3/uL (ref 140–400)
RBC: 4.3 MIL/uL (ref 3.80–5.10)
RDW: 13.6 % (ref 11.0–15.0)
WBC: 10.7 10*3/uL — ABNORMAL HIGH (ref 4.0–10.5)

## 2015-07-02 NOTE — Patient Instructions (Signed)
Encounter Diagnosis  Name Primary?  . Encounter for health maintenance examination in adult Yes   It was a pleasure to meet you today  Recommendations:  See your dentist yearly for routine dental care including hygiene visits twice yearly.  See us yearly for a physical  See your gynecologist yearly for routine gynecological care.  Women need a pap smear cancer screen typically every 3 years  Exercise regularly such as walking  Eat a health low fat diet, avoid fast food, fried foods  Your Tetanus vaccine is up to date  I recommend a flu shot every fall  We will call with lab results

## 2015-07-02 NOTE — Progress Notes (Signed)
Subjective:   HPI  Kimberly Brock is a 35 y.o. female who presents for a complete physical.  New patient today, accompanied her husband who is also here new patient physical  Concerns: none  Reviewed their medical, surgical, family, social, medication, and allergy history and updated chart as appropriate.  Past Medical History  Diagnosis Date  . Anemia   . Female circumcision     Past Surgical History  Procedure Laterality Date  . No past surgeries      Social History   Social History  . Marital Status: Married    Spouse Name: N/A  . Number of Children: 2  . Years of Education: 16   Occupational History  . Not on file.   Social History Main Topics  . Smoking status: Never Smoker   . Smokeless tobacco: Never Used  . Alcohol Use: No  . Drug Use: No  . Sexual Activity: Yes    Birth Control/ Protection: IUD, None   Other Topics Concern  . Not on file   Social History Narrative   Married to Kimberly Brock   Has 2yo daughter, 5yo son, exercises some.   From [place]    As of 06/2015    Family History  Problem Relation Age of Onset  . Cancer Neg Hx   . Stroke Neg Hx   . Heart disease Neg Hx     No current outpatient prescriptions on file.  No Known Allergies   Review of Systems Constitutional: -fever, -chills, -sweats, -unexpected weight change, -decreased appetite, -fatigue Allergy: -sneezing, -itching, -congestion Dermatology: -changing moles, --rash, -lumps ENT: -runny nose, -ear pain, -sore throat, -hoarseness, -sinus pain, -teeth pain, - ringing in ears, -hearing loss, -nosebleeds Cardiology: -chest pain, -palpitations, -swelling, -difficulty breathing when lying flat, -waking up short of breath Respiratory: -cough, -shortness of breath, -difficulty breathing with exercise or exertion, -wheezing, -coughing up blood Gastroenterology: -abdominal pain, -nausea, -vomiting, -diarrhea, -constipation, -blood in stool, -changes in bowel movement, -difficulty  swallowing or eating Hematology: -bleeding, -bruising  Musculoskeletal: -joint aches, -muscle aches, -joint swelling, -back pain, -neck pain, -cramping, -changes in gait Ophthalmology: denies vision changes, eye redness, itching, discharge Urology: -burning with urination, -difficulty urinating, -blood in urine, -urinary frequency, -urgency, -incontinence Neurology: -headache, -weakness, -tingling, -numbness, -memory loss, -falls, -dizziness Psychology: -depressed mood, -agitation, -sleep problems     Objective:   Physical Exam  BP 100/68 mmHg  Pulse 60  Wt 166 lb (75.297 kg)  LMP 06/06/2015  Breastfeeding? No  General appearance: alert, no distress, WD/WN, [place] female Skin: few scattered macules, no worrisome lesions HEENT: normocephalic, conjunctiva/corneas normal, sclerae anicteric, PERRLA, EOMi, nares patent, no discharge or erythema, pharynx normal Oral cavity: MMM, tongue normal, teeth normal Neck: supple, no lymphadenopathy, no thyromegaly, no masses, normal ROM, no bruits Chest: non tender, normal shape and expansion Heart: RRR, normal S1, S2, no murmurs Lungs: CTA bilaterally, no wheezes, rhonchi, or rales Abdomen: +bs, soft, non tender, non distended, no masses, no hepatomegaly, no splenomegaly, no bruits Back: non tender, normal ROM, no scoliosis Musculoskeletal: upper extremities non tender, no obvious deformity, normal ROM throughout, lower extremities non tender, no obvious deformity, normal ROM throughout Extremities: no edema, no cyanosis, no clubbing Pulses: 2+ symmetric, upper and lower extremities, normal cap refill Neurological: alert, oriented x 3, CN2-12 intact, strength normal upper extremities and lower extremities, sensation normal throughout, DTRs 2+ throughout, no cerebellar signs, gait normal Psychiatric: normal affect, behavior normal, pleasant  Breast/gyn/rectal - deferred to gyn    Assessment and  Plan :    Encounter Diagnoses  Name Primary?   . Encounter for health maintenance examination in adult Yes  . Routine general medical examination at a health care facility   . Language barrier to communication   . Anemia, iron deficiency   . Female circumcision     Physical exam - discussed healthy lifestyle, diet, exercise, preventative care, vaccinations, and addressed their concerns.  Handout given.  Patient Instructions   Encounter Diagnosis  Name Primary?  . Encounter for health maintenance examination in adult Yes   It was a pleasure to meet you today  Recommendations:  See your dentist yearly for routine dental care including hygiene visits twice yearly.  See [place] yearly for a physical  See your gynecologist yearly for routine gynecological care.  Women need a pap smear cancer screen typically every 3 years  Exercise regularly such as walking  Eat a health low fat diet, avoid fast food, fried foods  Your Tetanus vaccine is up to date  I recommend a flu shot every fall  We will call with lab results       Follow-up pending labs

## 2015-07-02 NOTE — Addendum Note (Signed)
Addended by: Kieth BrightlyLAWSON, Zackory Pudlo M on: 07/02/2015 02:29 PM   Modules accepted: Orders

## 2015-07-03 ENCOUNTER — Other Ambulatory Visit: Payer: Self-pay | Admitting: Medical

## 2015-07-03 LAB — VITAMIN D 25 HYDROXY (VIT D DEFICIENCY, FRACTURES): Vit D, 25-Hydroxy: 17 ng/mL — ABNORMAL LOW (ref 30–100)

## 2015-07-03 LAB — URINALYSIS, MICROSCOPIC ONLY
BACTERIA UA: NONE SEEN [HPF]
CRYSTALS: NONE SEEN [HPF]
Casts: NONE SEEN [LPF]
RBC / HPF: NONE SEEN RBC/HPF (ref <=2)
Squamous Epithelial / HPF: NONE SEEN [HPF] (ref <=5)
WBC, UA: NONE SEEN WBC/HPF (ref <=5)
YEAST: NONE SEEN [HPF]

## 2015-07-03 MED ORDER — VITAMIN D (ERGOCALCIFEROL) 1.25 MG (50000 UNIT) PO CAPS: 50000.0000 [IU] | ORAL_CAPSULE | ORAL | Status: DC

## 2015-08-06 ENCOUNTER — Encounter: Payer: Self-pay | Admitting: Family Medicine

## 2015-08-06 ENCOUNTER — Ambulatory Visit (INDEPENDENT_AMBULATORY_CARE_PROVIDER_SITE_OTHER): Payer: 59 | Admitting: Family Medicine

## 2015-08-06 VITALS — BP 110/80 | HR 80 | Temp 99.0°F | Ht 68.0 in | Wt 173.8 lb

## 2015-08-06 DIAGNOSIS — J069 Acute upper respiratory infection, unspecified: Secondary | ICD-10-CM

## 2015-08-06 DIAGNOSIS — J029 Acute pharyngitis, unspecified: Secondary | ICD-10-CM | POA: Diagnosis not present

## 2015-08-06 LAB — POCT RAPID STREP A (OFFICE): RAPID STREP A SCREEN: NEGATIVE

## 2015-08-06 NOTE — Progress Notes (Signed)
Chief Complaint  Patient presents with  . Sore Throat    started Sat, ST is the only symptom.    She presents with 5 days of sore throat. It hurts to swallow.  Left side of the throat hurts more than the right.  Denies runny nose, sneezing, sinus pressure.  She has been clearing her throat a lot, but denies cough.  Voice has been scratchy, yesterday she couldn't talk at all.    Daughter was sick about 10 days ago (cold, ear infection).  No other sick contacts.  She has been taking tylenol, which helps with the pain.  No known fevers. Feels fatigued, and somewhat achey  She is currently on a fast (can't eat or drink between 4am and 8:30 pm).   PMH unremarkable. SH reviewed--no alcohol or tobacco  Outpatient Encounter Prescriptions as of 08/06/2015  Medication Sig  . Vitamin D, Ergocalciferol, (DRISDOL) 50000 units CAPS capsule Take 1 capsule (50,000 Units total) by mouth every 7 (seven) days.   No facility-administered encounter medications on file as of 08/06/2015.   No Known Allergies   ROS: no known fever, chills, bleeding, bruising, rash.  Denies cough, shortness of breath, chest pain, nausea, vomiting, diarrhea, abdominal pain or urinary complaints.     PHYSICAL EXAM: BP 110/80 mmHg  Pulse 80  Temp(Src) 99 F (37.2 C) (Tympanic)  Ht 5\' 8"  (1.727 m)  Wt 173 lb 12.8 oz (78.835 kg)  BMI 26.43 kg/m2  LMP 07/28/2015 (Approximate)  Breastfeeding? No  Well developed, pleasant female, in no distress.  Hoarse voice and throat-clearing noted during visit.  HEENT: PERRL, EOMI, conjunctiva and sclera are clear. TM's and EAc's are normal.  Nasal mucosa is normal on the right, moderately edematous on the left, without erythema, and some clear mucus is noted. Sinuses are nontender. OP moist mucus membranes.  Mild erythema of anterior tonsillar pillars L>R. Tonsils are normal, no exudate Neck: mild shotty anterior cervical lymphadenopathy L>R, slightly tender Heart: regular rate and  rhythm without murmur Lungs: clear bilaterally Skin: normal turgor, no rash Neuro: alert and oriented. Cranial nerves intact. Normal strength, gait  Rapid strep: negative   ASSESSMENT/PLAN:   Acute upper respiratory infection - supportive measures reviewed  Sore throat - Plan: Rapid Strep A    Drink plenty of water (when you are able to). Use tylenol as needed for pain and fever. Use a decongestant such as sudafed--this will help decrease the nasal congestion and drainage that is going down the throat and contributing to the sore throat. Consider salt water gargles or chloraseptic spray.  If you have persistent fevers, worsening throat pain, trouble swallowing, or other new symptoms, please return for re-evaluation.

## 2015-08-06 NOTE — Patient Instructions (Signed)
  Drink plenty of water (when you are able to). Use tylenol as needed for pain and fever. Use a decongestant such as sudafed--this will help decrease the nasal congestion and drainage that is going down the throat and contributing to the sore throat. Consider salt water gargles or chloraseptic spray.  If you have persistent fevers, worsening throat pain, trouble swallowing, or other new symptoms, please return for re-evaluation.

## 2015-08-18 ENCOUNTER — Telehealth: Payer: Self-pay | Admitting: Internal Medicine

## 2015-08-18 NOTE — Telephone Encounter (Signed)
Pt's husband came by and dropped of forms that need to be completed from her physical back in April. Will come by Friday to pick forms up.

## 2015-08-20 ENCOUNTER — Telehealth: Payer: Self-pay | Admitting: Medical

## 2015-08-20 NOTE — Telephone Encounter (Signed)
LMTCB

## 2015-08-20 NOTE — Telephone Encounter (Signed)
Pls call about exercise per form, SCAN and return form.

## 2015-08-21 NOTE — Telephone Encounter (Signed)
pts husband picked up form

## 2015-08-21 NOTE — Telephone Encounter (Signed)
Pt's husband came by and picked up forms

## 2015-11-23 ENCOUNTER — Telehealth: Payer: Self-pay

## 2015-11-23 ENCOUNTER — Other Ambulatory Visit: Payer: Self-pay

## 2015-11-23 NOTE — Telephone Encounter (Signed)
Husband called back to office with waist measurement for wife of 35 inches. Trixie Rude/RLB

## 2015-11-29 ENCOUNTER — Telehealth: Payer: Self-pay | Admitting: Medical

## 2015-11-29 NOTE — Telephone Encounter (Signed)
I received physical forms for her and husband (see forms).  Please call them to ask about exercise per form.

## 2015-11-30 NOTE — Telephone Encounter (Signed)
Spoke to patient about how often she and her husband exercises. Completed questions on the physical form(s).

## 2015-12-01 ENCOUNTER — Telehealth: Payer: Self-pay

## 2015-12-01 NOTE — Telephone Encounter (Signed)
Called patient to inform her and spouse's physical forms are available for pick up at the front desk. No answer. Left vmail.

## 2015-12-02 ENCOUNTER — Telehealth: Payer: Self-pay | Admitting: Medical

## 2015-12-02 NOTE — Telephone Encounter (Signed)
Per pt's request, emailed completed biometric form to [email or web address]

## 2016-05-24 ENCOUNTER — Ambulatory Visit (INDEPENDENT_AMBULATORY_CARE_PROVIDER_SITE_OTHER): Payer: 59 | Admitting: Medical

## 2016-05-24 ENCOUNTER — Encounter: Payer: Self-pay | Admitting: Medical

## 2016-05-24 VITALS — Ht 67.5 in | Wt 181.6 lb

## 2016-05-24 DIAGNOSIS — R0789 Other chest pain: Secondary | ICD-10-CM

## 2016-05-24 DIAGNOSIS — Z129 Encounter for screening for malignant neoplasm, site unspecified: Secondary | ICD-10-CM

## 2016-05-24 DIAGNOSIS — E559 Vitamin D deficiency, unspecified: Secondary | ICD-10-CM | POA: Diagnosis not present

## 2016-05-24 DIAGNOSIS — R0781 Pleurodynia: Secondary | ICD-10-CM | POA: Diagnosis not present

## 2016-05-24 DIAGNOSIS — Z789 Other specified health status: Secondary | ICD-10-CM | POA: Diagnosis not present

## 2016-05-24 DIAGNOSIS — D509 Iron deficiency anemia, unspecified: Secondary | ICD-10-CM | POA: Diagnosis not present

## 2016-05-24 DIAGNOSIS — Z0000 Encounter for general adult medical examination without abnormal findings: Secondary | ICD-10-CM | POA: Diagnosis not present

## 2016-05-24 LAB — CBC WITH DIFFERENTIAL/PLATELET
BASOS ABS: 63 {cells}/uL (ref 0–200)
Basophils Relative: 1 %
EOS ABS: 504 {cells}/uL — AB (ref 15–500)
Eosinophils Relative: 8 %
HCT: 38.8 % (ref 35.0–45.0)
Hemoglobin: 12.7 g/dL (ref 11.7–15.5)
LYMPHS PCT: 33 %
Lymphs Abs: 2079 cells/uL (ref 850–3900)
MCH: 30.4 pg (ref 27.0–33.0)
MCHC: 32.7 g/dL (ref 32.0–36.0)
MCV: 92.8 fL (ref 80.0–100.0)
MONOS PCT: 8 %
MPV: 9.8 fL (ref 7.5–12.5)
Monocytes Absolute: 504 cells/uL (ref 200–950)
NEUTROS PCT: 50 %
Neutro Abs: 3150 cells/uL (ref 1500–7800)
PLATELETS: 344 10*3/uL (ref 140–400)
RBC: 4.18 MIL/uL (ref 3.80–5.10)
RDW: 13.1 % (ref 11.0–15.0)
WBC: 6.3 10*3/uL (ref 4.0–10.5)

## 2016-05-24 LAB — COMPREHENSIVE METABOLIC PANEL
ALBUMIN: 4.6 g/dL (ref 3.6–5.1)
ALT: 13 U/L (ref 6–29)
AST: 12 U/L (ref 10–30)
Alkaline Phosphatase: 65 U/L (ref 33–115)
BILIRUBIN TOTAL: 0.5 mg/dL (ref 0.2–1.2)
BUN: 10 mg/dL (ref 7–25)
CO2: 26 mmol/L (ref 20–31)
CREATININE: 0.64 mg/dL (ref 0.50–1.10)
Calcium: 9.5 mg/dL (ref 8.6–10.2)
Chloride: 107 mmol/L (ref 98–110)
GLUCOSE: 91 mg/dL (ref 65–99)
Potassium: 4.2 mmol/L (ref 3.5–5.3)
SODIUM: 140 mmol/L (ref 135–146)
Total Protein: 7 g/dL (ref 6.1–8.1)

## 2016-05-24 LAB — TSH: TSH: 0.59 mIU/L

## 2016-05-24 LAB — LIPID PANEL
Cholesterol: 124 mg/dL (ref <200)
HDL: 55 mg/dL (ref >50)
LDL CALC: 48 mg/dL (ref <100)
Total CHOL/HDL Ratio: 2.3 Ratio (ref <5.0)
Triglycerides: 103 mg/dL (ref <150)
VLDL: 21 mg/dL (ref <30)

## 2016-05-24 LAB — POCT URINALYSIS DIPSTICK
Bilirubin, UA: NEGATIVE
GLUCOSE UA: NEGATIVE
Ketones, UA: NEGATIVE
LEUKOCYTES UA: NEGATIVE
Nitrite, UA: NEGATIVE
Protein, UA: NEGATIVE
SPEC GRAV UA: 1.015 (ref 1.030–1.035)
UROBILINOGEN UA: NEGATIVE (ref ?–2.0)
pH, UA: 6 (ref 5.0–8.0)

## 2016-05-24 NOTE — Progress Notes (Signed)
Subjective:   HPI  Kimberly Brock is a 36 y.o. female who presents for physical   Accompanied her husband who is also here for physical.  He interprets some but her english is much better than a few years ago. Chief Complaint  Patient presents with  . Annual Exam    physical    Medical care team includes: Ernst BreachYSINGER, DAVID SHANE, PA-C here for primary care Dentist [facility]  Concerns: Left rib/chest wall pain intermittent x 6 weeks.  Reviewed their medical, surgical, family, social, medication, and allergy history and updated chart as appropriate.  Past Medical History:  Diagnosis Date  . Allergy   . Anemia   . Female circumcision     Past Surgical History:  Procedure Laterality Date  . CIRCUMCISION     female    Social History   Social History  . Marital status: Married    Spouse name: N/A  . Number of children: 4  . Years of education: 4916   Occupational History  . Not on file.   Social History Main Topics  . Smoking status: Never Smoker  . Smokeless tobacco: Never Used  . Alcohol use No  . Drug use: No  . Sexual activity: Yes    Birth control/ protection: IUD, None   Other Topics Concern  . Not on file   Social History Narrative   Married to [name]   Has 4 children, 2yo daughter, 5yo son, 8yo, 12yo, exercises some.   From [place]    As of 05/2016    Family History  Problem Relation Age of Onset  . Cancer Neg Hx   . Stroke Neg Hx   . Heart disease Neg Hx      Current Outpatient Prescriptions:  .  Cetirizine HCl (ALL DAY ALLERGY) 10 MG CAPS, Take by mouth., Disp: , Rfl:   No Known Allergies   Review of Systems Constitutional: -fever, -chills, -sweats, -unexpected weight change, -decreased appetite, -fatigue Allergy: -sneezing, -itching, -congestion Dermatology: -changing moles, --rash, -lumps ENT: -runny nose, -ear pain, -sore throat, -hoarseness, -sinus pain, -teeth pain, - ringing in ears, -hearing loss,  -nosebleeds Cardiology: +chest pain, -palpitations, -swelling, -difficulty breathing when lying flat, -waking up short of breath Respiratory: -cough, -shortness of breath, -difficulty breathing with exercise or exertion, -wheezing, -coughing up blood Gastroenterology: -abdominal pain, -nausea, -vomiting, -diarrhea, -constipation, -blood in stool, -changes in bowel movement, -difficulty swallowing or eating Hematology: -bleeding, -bruising  Musculoskeletal: -joint aches, -muscle aches, -joint swelling, -back pain, -neck pain, -cramping, -changes in gait Ophthalmology: denies vision changes, eye redness, itching, discharge Urology: -burning with urination, -difficulty urinating, -blood in urine, -urinary frequency, -urgency, -incontinence Neurology: -headache, -weakness, -tingling, -numbness, -memory loss, -falls, -dizziness Psychology: -depressed mood, -agitation, -sleep problems Breast/gyn: -breast tenderness, -discharge, -lumps, -vaginal discharge,- irregular periods, -heavy periods    Objective:  Ht 5' 7.5" (1.715 m)   Wt 181 lb 9.6 oz (82.4 kg)   BMI 28.02 kg/m   General appearance: alert, no distress, WD/WN, African American [place] female Skin: no worrisome findings HEENT: normocephalic, conjunctiva/corneas normal, sclerae anicteric, PERRLA, EOMi, nares patent, no discharge or erythema, pharynx normal Oral cavity: MMM, tongue normal, teeth normal Neck: supple, no lymphadenopathy, no thyromegaly, no masses, normal ROM, no bruits Chest: mild tenderness left lower anterior chest wall, otherwise non tender, normal shape and expansion Heart: RRR, normal S1, S2, no murmurs Lungs: CTA bilaterally, no wheezes, rhonchi, or rales Abdomen: +bs, soft, non tender, non distended, no masses, no hepatomegaly, no  splenomegaly, no bruits Back: non tender, normal ROM, no scoliosis Musculoskeletal: upper extremities non tender, no obvious deformity, normal ROM throughout, lower extremities non tender,  no obvious deformity, normal ROM throughout Extremities: no edema, no cyanosis, no clubbing Pulses: 2+ symmetric, upper and lower extremities, normal cap refill Neurological: alert, oriented x 3, CN2-12 intact, strength normal upper extremities and lower extremities, sensation normal throughout, DTRs 2+ throughout, no cerebellar signs, gait normal Psychiatric: normal affect, behavior normal, pleasant  Breast/gyn/rectal - deferred to gynecology  Assessment and Plan :    Encounter Diagnoses  Name Primary?  . Encounter for health maintenance examination in adult Yes  . Iron deficiency anemia, unspecified iron deficiency anemia type   . Language barrier to communication   . Vitamin D deficiency   . Screening for cancer   . Rib pain on left side   . Chest wall pain     Physical exam - discussed and counseled on healthy lifestyle, diet, exercise, preventative care, vaccinations, sick and well care, proper use of emergency dept and after hours care, and addressed their concerns.    Cancer screening Discussed and advised monthly self breast exams Discussed pap smear recommendations.  She will f/u with gyn Discussed other symptoms/signs that would suggest need to do other evaluation, such as weight loss, skin lesion changes, fever, night sweats, or other.  Vaccinations: Counseled on the following vaccines:  influenza  Acute issues discussed: Go for xray for unexplained left chest wall pain  Separate significant chronic issues discussed: Vit D deficiency - labs today  [name] was seen today for annual exam.  Diagnoses and all orders for this visit:  Encounter for health maintenance examination in adult -     Comprehensive metabolic panel -     CBC with Differential/Platelet -     Lipid panel -     Hemoglobin A1c -     VITAMIN D 25 Hydroxy (Vit-D Deficiency, Fractures) -     TSH  Iron deficiency anemia, unspecified iron deficiency anemia type -     CBC with  Differential/Platelet  Language barrier to communication  Vitamin D deficiency -     VITAMIN D 25 Hydroxy (Vit-D Deficiency, Fractures)  Screening for cancer  Rib pain on left side -     DG Ribs Unilateral Left; Future -     DG Chest 2 View; Future  Chest wall pain -     DG Ribs Unilateral Left; Future -     DG Chest 2 View; Future    Follow-up pending labs, yearly for physical

## 2016-05-24 NOTE — Addendum Note (Signed)
Addended by: Winn JockVALENTINE, Daleah Coulson N on: 05/24/2016 11:09 AM   Modules accepted: Orders

## 2016-05-25 ENCOUNTER — Other Ambulatory Visit: Payer: Self-pay | Admitting: Medical

## 2016-05-25 LAB — HEMOGLOBIN A1C
Hgb A1c MFr Bld: 5 % (ref <5.7)
Mean Plasma Glucose: 97 mg/dL

## 2016-05-25 LAB — VITAMIN D 25 HYDROXY (VIT D DEFICIENCY, FRACTURES): VIT D 25 HYDROXY: 28 ng/mL — AB (ref 30–100)

## 2016-05-25 MED ORDER — VITAMIN D (ERGOCALCIFEROL) 1.25 MG (50000 UNIT) PO CAPS
50000.0000 [IU] | ORAL_CAPSULE | ORAL | 3 refills | Status: DC
Start: 2016-05-25 — End: 2017-01-11

## 2016-08-12 ENCOUNTER — Other Ambulatory Visit: Payer: Self-pay

## 2016-08-12 DIAGNOSIS — E559 Vitamin D deficiency, unspecified: Secondary | ICD-10-CM

## 2016-08-25 ENCOUNTER — Other Ambulatory Visit: Payer: 59

## 2016-08-25 DIAGNOSIS — E559 Vitamin D deficiency, unspecified: Secondary | ICD-10-CM

## 2016-08-26 LAB — VITAMIN D 25 HYDROXY (VIT D DEFICIENCY, FRACTURES): Vit D, 25-Hydroxy: 37 ng/mL (ref 30–100)

## 2017-01-11 ENCOUNTER — Ambulatory Visit (INDEPENDENT_AMBULATORY_CARE_PROVIDER_SITE_OTHER): Payer: 59 | Admitting: Family Medicine

## 2017-01-11 ENCOUNTER — Encounter: Payer: Self-pay | Admitting: Family Medicine

## 2017-01-11 VITALS — BP 100/72 | HR 88 | Temp 98.0°F | Ht 67.5 in | Wt 171.2 lb

## 2017-01-11 DIAGNOSIS — R222 Localized swelling, mass and lump, trunk: Secondary | ICD-10-CM | POA: Diagnosis not present

## 2017-01-11 NOTE — Patient Instructions (Signed)
  I don't think the lump that we feel is anything worrisome.  It may be related to an injury/trauma vs a benign fatty tumor.  Let's keep an eye on it for a little while longer, looking for any change.  If it rapidly increases in size or becomes painful, return right away for re-evaluation.  Otherwise, let's plan to have it checked at your next physical.

## 2017-01-11 NOTE — Progress Notes (Signed)
Chief Complaint  Patient presents with  . Mass    left side(back of hip area) x 2 months and bump has grown in the last few weeks. No pain involved with the bump, just concerned.   . Flu Vaccine    declined.   She presents with concern over a lump on her left back. No known trauma or injury that she can recall. It does not hurt. First noticed it about 2 months ago, just happened to be rubbing/touching with her hand. Thinks it has grown slightly since she first noticed.  She has had some weight loss  PMH, PSH, SH reviewed  Outpatient Encounter Medications as of 01/11/2017  Medication Sig  . Cetirizine HCl (ALL DAY ALLERGY) 10 MG CAPS Take by mouth.  . Cholecalciferol (VITAMIN D) 2000 units tablet Take 2,000 Units daily by mouth.  . [DISCONTINUED] Vitamin D, Ergocalciferol, (DRISDOL) 50000 units CAPS capsule Take 1 capsule (50,000 Units total) by mouth every 7 (seven) days.   No facility-administered encounter medications on file as of 01/11/2017.    No Known Allergies  ROS: no fever,chills, URI symptoms, cough, shortness of breath, chest pain or other concerns. 10# weight loss since march, only down 2# from last year (had gained and lost)   PHYSICAL EXAM: BP 100/72   Pulse 88   Temp 98 F (36.7 C)   Ht 5' 7.5" (1.715 m)   Wt 171 lb 3.2 oz (77.7 kg)   LMP 01/02/2017 (Approximate)   BMI 26.42 kg/m    Wt Readings from Last 3 Encounters:  01/11/17 171 lb 3.2 oz (77.7 kg)  05/24/16 181 lb 9.6 oz (82.4 kg)  08/06/15 173 lb 12.8 oz (78.8 kg)   Well appearing, pleasant female, accompanied by her husband and 603 yo daughter. HEENT: conjunctiva and sclera are clear, EOMI Neck: no lymphadenopathy or mass Heart: regular rate and rhythm Lungs: clear bilaterally Back: no spinal tenderness or CVA tenderness 2 x 4 cm subcutaneous firm area, overlying the inferolateral ribs on the left side.  Overlying skin is normal Psych: normal mood, affect, hygiene and grooming Neuro: alert and  oriented, normal cranial nerves and great   ASSESSMENT/PLAN:  Mass on back  Suspect benign process--lipoma, vs hematoma/scar if any h/o trauma.  Discussed concern if there is increase in size or tenderness. To return sooner for recheck; if no change, then recheck at her next physical, due in March.  Flu shot was highly encouraged--declined by pt.   After visit, her last physical (05/2016) was reviewed, noted that she was complaining of chest pain.  It appears to be in a different location than where her lump is now.  She never went for the x-ray as ordered. If ongoing discomfort/lump, and if still seems to be in close proximity to the ribs, may be worth sending for x-ray, vs other imaging procedure, vs referral to surgery for excision/biopsy.   I don't think the lump that we feel is anything worrisome.  It may be related to an injury/trauma vs a benign fatty tumor.  Let's keep an eye on it for a little while longer, looking for any change.  If it rapidly increases in size or becomes painful, return right away for re-evaluation.  Otherwise, let's plan to have it checked at your next physical.

## 2017-02-10 ENCOUNTER — Encounter: Payer: Self-pay | Admitting: Family Medicine

## 2017-02-10 ENCOUNTER — Ambulatory Visit (INDEPENDENT_AMBULATORY_CARE_PROVIDER_SITE_OTHER): Payer: 59 | Admitting: Family Medicine

## 2017-02-10 VITALS — BP 122/80 | HR 85 | Temp 98.4°F | Ht 67.0 in | Wt 177.0 lb

## 2017-02-10 DIAGNOSIS — R81 Glycosuria: Secondary | ICD-10-CM

## 2017-02-10 DIAGNOSIS — R300 Dysuria: Secondary | ICD-10-CM | POA: Diagnosis not present

## 2017-02-10 DIAGNOSIS — R350 Frequency of micturition: Secondary | ICD-10-CM

## 2017-02-10 LAB — GLUCOSE, POCT (MANUAL RESULT ENTRY): POC GLUCOSE: 104 mg/dL — AB (ref 70–99)

## 2017-02-10 LAB — POCT URINALYSIS DIP (PROADVANTAGE DEVICE)
BILIRUBIN UA: NEGATIVE
Blood, UA: NEGATIVE
Ketones, POC UA: NEGATIVE mg/dL
LEUKOCYTES UA: NEGATIVE
NITRITE UA: NEGATIVE
PH UA: 6 (ref 5.0–8.0)
Protein Ur, POC: NEGATIVE mg/dL
Specific Gravity, Urine: 1.03
Urobilinogen, Ur: NEGATIVE

## 2017-02-10 LAB — POCT GLYCOSYLATED HEMOGLOBIN (HGB A1C): HEMOGLOBIN A1C: 5.2

## 2017-02-10 MED ORDER — NITROFURANTOIN MONOHYD MACRO 100 MG PO CAPS: 100.0000 mg | ORAL_CAPSULE | Freq: Two times a day (BID) | ORAL | 0 refills | Status: DC

## 2017-02-10 NOTE — Patient Instructions (Signed)
Take the antibiotic as prescribed with food.   If your symptoms do not completely resolve then follow up.

## 2017-02-10 NOTE — Progress Notes (Signed)
   Subjective:    Patient ID: Kimberly Brock, female    DOB: 03-12-1980, 36 y.o.   MRN: [id number]  HPI Chief Complaint  Patient presents with  . Acute Visit    lower back, achy pain   She is here with complaints of a 3 day history of urinary frequency, urgency, and low back pain. Denies fever, chills, chest pain, abdominal pain, N/V/D.   She has been drinking more water for the past 2 days and states symptoms have improved but only sightly. She is not taking any other medication.  Denies increased thirst or unexplained weight loss.   Denies history of UTI.   LPM: 01/29/2017 IUD in place.   Reviewed allergies, medications, past medical, surgical, family, and social history.    Review of Systems Pertinent positives and negatives in the history of present illness.      Objective:   Physical Exam BP 122/80 (BP Location: Right Arm, Patient Position: Sitting)   Pulse 85   Temp 98.4 F (36.9 C) (Oral)   Ht 5\' 7"  (1.702 m)   Wt 177 lb (80.3 kg)   LMP 01/30/2017   SpO2 98%   BMI 27.72 kg/m   Alert and in no distress. Pharyngeal area is normal. Neck is supple without adenopathy or thyromegaly. Cardiac exam shows a regular sinus rhythm without murmurs or gallops. Lungs are clear to auscultation. Abdomen soft, non tender, normal BS, no CVA tenderness. Skin warm and dry.       Assessment & Plan:  Urinary frequency - Plan: POCT Urinalysis DIP (Proadvantage Device), POCT glucose (manual entry), Urine Culture, nitrofurantoin, macrocrystal-monohydrate, (MACROBID) 100 MG capsule, POCT glycosylated hemoglobin (Hb A1C)  Dysuria - Plan: POCT Urinalysis DIP (Proadvantage Device), Urine Culture, nitrofurantoin, macrocrystal-monohydrate, (MACROBID) 100 MG capsule  Glucosuria with normal serum glucose - Plan: POCT glucose (manual entry), POCT glycosylated hemoglobin (Hb A1C)  UA dipstick: 2+ glucose otherwise negative.  POCT random glucose 104 Hgb A1c 5.2% Discussed that there is no  sign of diabetes. Will treat her with Macrobid for UTI.  Urine sent for culture.  She will follow up if not back to baseline after completing the antibiotic.

## 2017-02-11 LAB — URINE CULTURE
MICRO NUMBER: 81379646
SPECIMEN QUALITY: ADEQUATE

## 2017-05-03 ENCOUNTER — Telehealth: Payer: Self-pay

## 2017-05-03 NOTE — Telephone Encounter (Signed)
Pt husband call to schedule a CPE . Pt husband and pt are Kimberly Brock pt but pt husband has requested that for her physical she be seen by a woman . Please advise if this is ok that pt [facility]  March 27th at 9:30am please advise and thanks [facility]

## 2017-05-03 NOTE — Telephone Encounter (Signed)
I don't see why Vickie can't see her for female exam and I do her physical.  That is my recommendation

## 2017-05-03 NOTE — Telephone Encounter (Signed)
Kimberly Brock has done her CPEs in the past. I think she should continue with this. I am ok doing her pelvic exam and pap smear if that is the issue.

## 2017-05-04 ENCOUNTER — Telehealth: Payer: Self-pay

## 2017-05-04 NOTE — Telephone Encounter (Signed)
Called pt husband to get her CPE scheduled. No answer and message was left that Kimberly Brock says she can due the female part of physical and shane can do the rest of the physical. Thanks [facility]

## 2017-05-04 NOTE — Telephone Encounter (Signed)
Called pt husband back a left a message that it will be ok for Vickie to do female part of physical and shane do the rest . Pt was advised to call back to make appt. Thanks [facility]

## 2017-05-09 LAB — RESULTS CONSOLE HPV: CHL HPV: NEGATIVE

## 2017-05-09 LAB — HM PAP SMEAR: HM Pap smear: NEGATIVE

## 2017-06-01 ENCOUNTER — Encounter: Payer: Self-pay | Admitting: Medical

## 2017-06-01 ENCOUNTER — Ambulatory Visit (INDEPENDENT_AMBULATORY_CARE_PROVIDER_SITE_OTHER): Payer: Managed Care, Other (non HMO) | Admitting: Medical

## 2017-06-01 VITALS — BP 116/80 | HR 86 | Temp 98.6°F | Ht 67.5 in | Wt 170.4 lb

## 2017-06-01 DIAGNOSIS — E559 Vitamin D deficiency, unspecified: Secondary | ICD-10-CM | POA: Diagnosis not present

## 2017-06-01 DIAGNOSIS — Z0000 Encounter for general adult medical examination without abnormal findings: Secondary | ICD-10-CM

## 2017-06-01 NOTE — Patient Instructions (Signed)
Thanks for trusting us with your health care and for coming in for a physical today.  Below are some general recommendations I have for you:  Yearly screenings See your eye doctor yearly for routine vision care. See your dentist yearly for routine dental care including hygiene visits twice yearly. See me here yearly for a routine physical and preventative care visit   Cancer screening Colon cancer screening:   Age 37yo.  Mammogram age 37yo  You are up to date on pap smear through gynecology           Please follow up yearly for a physical.  Eye doctors: Dr. Glenford PeersHoward McFarland [address], [place], [place] [address] [phone]   [facility] Dr. Gelene Minkimothy Koop [address], [address] [place], [place] [address]  [phone] [email or web address]   Vincenza HewsSigmund S. Gould, M.D. Susanne GreenhouseJason A. Gould, O.D. [address] [place], [place] [address] Medical telephone: [phone] Optical telephone: [phone]   I have included other useful information below for your review.  Preventative Care for Adults - Female      MAINTAIN REGULAR HEALTH EXAMS:  A routine yearly physical is a good way to check in with your primary care provider about your health and preventive screening. It is also an opportunity to share updates about your health and any concerns you have, and receive a thorough all-over exam.   Most health insurance companies pay for at least some preventative services.  Check with your health plan for specific coverages.  WHAT PREVENTATIVE SERVICES DO WOMEN NEED?  Adult women should have their weight and blood pressure checked regularly.   Women age 36 and older should have their cholesterol levels checked regularly.  Women should be screened for cervical cancer with a Pap smear and pelvic exam beginning at either age 36, or 3 years after they become sexually activity.    Breast cancer screening generally begins at age 36 with a mammogram and  breast exam by your primary care provider.    Beginning at age 36 and continuing to age 36, women should be screened for colorectal cancer.  Certain people may need continued testing until age 36.  Updating vaccinations is part of preventative care.  Vaccinations help protect against diseases such as the flu.  Osteoporosis is a disease in which the bones lose minerals and strength as we age. Women ages 5865 and over should discuss this with their caregivers, as should women after menopause who have other risk factors.  Lab tests are generally done as part of preventative care to screen for anemia and blood disorders, to screen for problems with the kidneys and liver, to screen for bladder problems, to check blood sugar, and to check your cholesterol level.  Preventative services generally include counseling about diet, exercise, avoiding tobacco, drugs, excessive alcohol consumption, and sexually transmitted infections.    GENERAL RECOMMENDATIONS FOR GOOD HEALTH:  Healthy diet:  Eat a variety of foods, including fruit, vegetables, animal or vegetable protein, such as meat, fish, chicken, and eggs, or beans, lentils, tofu, and grains, such as rice.  Drink plenty of water daily.  Decrease saturated fat in the diet, avoid lots of red meat, processed foods, sweets, fast foods, and fried foods.  Exercise:  Aerobic exercise helps maintain good heart health. At least 30-40 minutes of moderate-intensity exercise is recommended. For example, a brisk walk that increases your heart rate and breathing. This should be done on most days of the week.  Find a type of exercise or a variety of exercises that you enjoy so that it becomes a part of your daily life.  Examples are running, walking, swimming, water aerobics, and biking.  For motivation and support, explore group exercise such as aerobic class, spin class, Zumba, Yoga,or  martial arts, etc.    Set exercise goals for yourself, such as a certain  weight goal, walk or run in a race such as a 5k walk/run.  Speak to your primary care provider about exercise goals.  Disease prevention:  If you smoke or chew tobacco, find out from your caregiver how to quit. It can literally save your life, no matter how long you have been a tobacco user. If you do not use tobacco, never begin.   Maintain a healthy diet and normal weight. Increased weight leads to problems with blood pressure and diabetes.   The Body Mass Index or BMI is a way of measuring how much of your body is fat. Having a BMI above 27 increases the risk of heart disease, diabetes, hypertension, stroke and other problems related to obesity. Your caregiver can help determine your BMI and based on it develop an exercise and dietary program to help you achieve or maintain this important measurement at a healthful level.  High blood pressure causes heart and blood vessel problems.  Persistent high blood pressure should be treated with medicine if weight loss and exercise do not work.   Fat and cholesterol leaves deposits in your arteries that can block them. This causes heart disease and vessel disease elsewhere in your body.  If your cholesterol is found to be high, or if you have heart disease or certain other medical conditions, then you may need to have your cholesterol monitored frequently and be treated with medication.   Ask if you should have a cardiac stress test if your history suggests this. A stress test is a test done on a treadmill that looks for heart disease. This test can find disease prior to there being a problem.  Menopause can be associated with physical symptoms and risks. Hormone replacement therapy is available to decrease these. You should talk to your caregiver about whether starting or continuing to take hormones is right for you.   Osteoporosis is a disease in which the bones lose minerals and strength as we age. This can result in serious bone fractures. Risk of  osteoporosis can be identified using a bone density scan. Women ages 74 and over should discuss this with their caregivers, as should women after menopause who have other risk factors. Ask your caregiver whether you should be taking a calcium supplement and Vitamin D, to reduce the rate of osteoporosis.   Avoid drinking alcohol in excess (more than two drinks per day).  Avoid use of street drugs. Do not share needles with anyone. Ask for professional help if you need assistance or instructions on stopping the use of alcohol, cigarettes, and/or drugs.  Brush your teeth twice a day with fluoride toothpaste, and floss once a day. Good oral hygiene prevents tooth decay and gum disease. The problems can be painful, unattractive, and can cause other health problems. Visit your dentist for a routine oral and dental check up and preventive care every 6-12 months.   Look at your skin regularly.  Use a mirror to look at your back. Notify your caregivers of changes in moles, especially if there are changes in shapes, colors, a size larger than a pencil eraser, an irregular  border, or development of new moles.  Safety:  Use seatbelts 100% of the time, whether driving or as a passenger.  Use safety devices such as hearing protection if you work in environments with loud noise or significant background noise.  Use safety glasses when doing any work that could send debris in to the eyes.  Use a helmet if you ride a bike or motorcycle.  Use appropriate safety gear for contact sports.  Talk to your caregiver about gun safety.  Use sunscreen with a SPF (or skin protection factor) of 15 or greater.  Lighter skinned people are at a greater risk of skin cancer. Don't forget to also wear sunglasses in order to protect your eyes from too much damaging sunlight. Damaging sunlight can accelerate cataract formation.   Practice safe sex. Use condoms. Condoms are used for birth control and to help reduce the spread of sexually  transmitted infections (or STIs).  Some of the STIs are gonorrhea (the clap), chlamydia, syphilis, trichomonas, herpes, HPV (human papilloma virus) and HIV (human immunodeficiency virus) which causes AIDS. The herpes, HIV and HPV are viral illnesses that have no cure. These can result in disability, cancer and death.   Keep carbon monoxide and smoke detectors in your home functioning at all times. Change the batteries every 6 months or use a model that plugs into the wall.   Vaccinations:  Stay up to date with your tetanus shots and other required immunizations. You should have a booster for tetanus every 10 years. Be sure to get your flu shot every year, since 5%-20% of the [place] population comes down with the flu. The flu vaccine changes each year, so being vaccinated once is not enough. Get your shot in the fall, before the flu season peaks.   Other vaccines to consider:  Human Papilloma Virus or HPV causes cancer of the cervix, and other infections that can be transmitted from person to person. There is a vaccine for HPV, and females should get immunized between the ages of 82 and 38. It requires a series of 3 shots.   Pneumococcal vaccine to protect against certain types of pneumonia.  This is normally recommended for adults age 55 or older.  However, adults younger than 36 years old with certain underlying conditions such as diabetes, heart or lung disease should also receive the vaccine.  Shingles vaccine to protect against Varicella Zoster if you are older than age 79, or younger than 36 years old with certain underlying illness.  If you have not had the Shingrix vaccine, please call your insurer to inquire about coverage for the Shingrix vaccine given in 2 doses.   Some insurers cover this vaccine after age 79, some cover this after age 21.  If your insurer covers this, then call to schedule appointment to have this vaccine here  Hepatitis A vaccine to protect against a form of infection of  the liver by a virus acquired from food.  Hepatitis B vaccine to protect against a form of infection of the liver by a virus acquired from blood or body fluids, particularly if you work in health care.  If you plan to travel internationally, check with your local health department for specific vaccination recommendations.  Cancer Screening:  Breast cancer screening is essential to preventive care for women. All women age 32 and older should perform a breast self-exam every month. At age 51 and older, women should have their caregiver complete a breast exam each year. Women at ages 21  and older should have a mammogram (x-ray film) of the breasts. Your caregiver can discuss how often you need mammograms.    Cervical cancer screening includes taking a Pap smear (sample of cells examined under a microscope) from the cervix (end of the uterus). It also includes testing for HPV (Human Papilloma Virus, which can cause cervical cancer). Screening and a pelvic exam should begin at age 54, or 3 years after a woman becomes sexually active. Screening should occur every year, with a Pap smear but no HPV testing, up to age 61. After age 83, you should have a Pap smear every 3 years with HPV testing, if no HPV was found previously.   Most routine colon cancer screening begins at the age of 39. On a yearly basis, doctors may provide special easy to use take-home tests to check for hidden blood in the stool. Sigmoidoscopy or colonoscopy can detect the earliest forms of colon cancer and is life saving. These tests use a small camera at the end of a tube to directly examine the colon. Speak to your caregiver about this at age 41, when routine screening begins (and is repeated every 5 years unless early forms of pre-cancerous polyps or small growths are found).

## 2017-06-01 NOTE — Progress Notes (Signed)
Subjective:   HPI  Kimberly Brock is a 37 y.o. female who presents for physical   Accompanied her husband Chief Complaint  Patient presents with  . Annual Exam   Medical care team includes: Arlow Spiers, Cleda Mccreedy here for primary care Dentist Eye doctor [place], [place], Dr. Dois Davenport Rivard  No prior mammogram No prior colonoscopy Last pap 05/2017 with gynecology, [facility], Dr. Dois Davenport Rivard Wants me to relook at left chest wall knot Dr. Lynelle Doctor looked at few weeks ago here.  Reviewed their medical, surgical, family, social, medication, and allergy history and updated chart as appropriate.  Past Medical History:  Diagnosis Date  . Allergy   . Female circumcision     Past Surgical History:  Procedure Laterality Date  . CIRCUMCISION     female    Social History   Socioeconomic History  . Marital status: Married    Spouse name: Not on file  . Number of children: 4  . Years of education: 41  . Highest education level: Not on file  Occupational History  . Not on file  Social Needs  . Financial resource strain: Not on file  . Food insecurity:    Worry: Not on file    Inability: Not on file  . Transportation needs:    Medical: Not on file    Non-medical: Not on file  Tobacco Use  . Smoking status: Never Smoker  . Smokeless tobacco: Never Used  Substance and Sexual Activity  . Alcohol use: No  . Drug use: No  . Sexual activity: Yes    Birth control/protection: IUD, None  Lifestyle  . Physical activity:    Days per week: Not on file    Minutes per session: Not on file  . Stress: Not on file  Relationships  . Social connections:    Talks on phone: Not on file    Gets together: Not on file    Attends religious service: Not on file    Active member of club or organization: Not on file    Attends meetings of clubs or organizations: Not on file    Relationship status: Not on file  . Intimate partner violence:    Fear of current or ex partner: Not on file     Emotionally abused: Not on file    Physically abused: Not on file    Forced sexual activity: Not on file  Other Topics Concern  . Not on file  Social History Narrative   Married to [employer]   Has 4 children,  exercises some.   From [place]    As of 05/2017    Family History  Problem Relation Age of Onset  . Cancer Neg Hx   . Stroke Neg Hx   . Heart disease Neg Hx   . Diabetes Neg Hx   . Hyperlipidemia Neg Hx   . Hypertension Neg Hx      Current Outpatient Medications:  .  Cetirizine HCl (ALL DAY ALLERGY) 10 MG CAPS, Take by mouth., Disp: , Rfl:  .  Cholecalciferol (VITAMIN D) 2000 units tablet, Take 2,000 Units daily by mouth., Disp: , Rfl:  .  nitrofurantoin, macrocrystal-monohydrate, (MACROBID) 100 MG capsule, Take 1 capsule (100 mg total) by mouth 2 (two) times daily., Disp: 10 capsule, Rfl: 0  No Known Allergies   Review of Systems Constitutional: -fever, -chills, -sweats, -unexpected weight change, -decreased appetite, -fatigue Allergy: -sneezing, -itching, -congestion Dermatology: -changing moles, --rash, -lumps ENT: -runny nose, -ear pain, -sore  throat, -hoarseness, -sinus pain, -teeth pain, - ringing in ears, -hearing loss, -nosebleeds Cardiology: -chest pain, -palpitations, -swelling, -difficulty breathing when lying flat, -waking up short of breath Respiratory: -cough, -shortness of breath, -difficulty breathing with exercise or exertion, -wheezing, -coughing up blood Gastroenterology: -abdominal pain, -nausea, -vomiting, -diarrhea, -constipation, -blood in stool, -changes in bowel movement, -difficulty swallowing or eating Hematology: -bleeding, -bruising  Musculoskeletal: -joint aches, -muscle aches, -joint swelling, -back pain, -neck pain, -cramping, -changes in gait Ophthalmology: denies vision changes, eye redness, itching, discharge Urology: -burning with urination, -difficulty urinating, -blood in urine, -urinary frequency, -urgency,  -incontinence Neurology: -headache, -weakness, -tingling, -numbness, -memory loss, -falls, -dizziness Psychology: -depressed mood, -agitation, -sleep problems Breast/gyn: -breast tenderness, -discharge, -lumps, -vaginal discharge,- irregular periods, -heavy periods    Objective:  BP 116/80 (BP Location: Right Arm, Patient Position: Sitting, Cuff Size: Large)   Pulse 86   Temp 98.6 F (37 C)   Ht 5' 7.5" (1.715 m)   Wt 170 lb 6.4 oz (77.3 kg)   LMP 05/09/2017 (Approximate)   SpO2 98%   BMI 26.29 kg/m   General appearance: alert, no distress, WD/WN, African American [place] female Skin: no worrisome findings HEENT: normocephalic, conjunctiva/corneas normal, sclerae anicteric, PERRLA, EOMi, nares patent, no discharge or erythema, pharynx normal Oral cavity: MMM, tongue normal, teeth normal Neck: supple, no lymphadenopathy, no thyromegaly, no masses, normal ROM, no bruits Chest: mild tenderness left lower anterior chest wall, 2cm x 4cm area of tendnerss possible lipoma, otherwise non tender, normal shape and expansion Heart: RRR, normal S1, S2, no murmurs Lungs: CTA bilaterally, no wheezes, rhonchi, or rales Abdomen: +bs, soft, non tender, non distended, no masses, no hepatomegaly, no splenomegaly, no bruits Back: non tender, normal ROM, no scoliosis Musculoskeletal: upper extremities non tender, no obvious deformity, normal ROM throughout, lower extremities non tender, no obvious deformity, normal ROM throughout Extremities: no edema, no cyanosis, no clubbing Pulses: 2+ symmetric, upper and lower extremities, normal cap refill Neurological: alert, oriented x 3, CN2-12 intact, strength normal upper extremities and lower extremities, sensation normal throughout, DTRs 2+ throughout, no cerebellar signs, gait normal Psychiatric: normal affect, behavior normal, pleasant  Breast/gyn/rectal - deferred to gynecology   Assessment and Plan :    Encounter Diagnoses  Name Primary?  . Routine  general medical examination at a health care facility Yes  . Vitamin D deficiency     Physical exam - discussed and counseled on healthy lifestyle, diet, exercise, preventative care, vaccinations, sick and well care, proper use of emergency dept and after hours care, and addressed their concerns.    Cancer screening Discussed and advised monthly self breast exams Discussed pap smear recommendations.  She sees  gyn Discussed other symptoms/signs that would suggest need to do other evaluation, such as weight loss, skin lesion changes, fever, night sweats, or other.  Vaccinations: Counseled on the following vaccines:  Influenza td  Separate significant chronic issues discussed: Vit D deficiency - labs today  Elisha Headlandabila was seen today for annual exam.  Diagnoses and all orders for this visit:  Routine general medical examination at a health care facility -     CBC with Differential/Platelet -     Lipid panel -     Basic metabolic panel -     VITAMIN D 25 Hydroxy (Vit-D Deficiency, Fractures)  Vitamin D deficiency -     VITAMIN D 25 Hydroxy (Vit-D Deficiency, Fractures)    Follow-up pending labs, yearly for physical

## 2017-06-02 ENCOUNTER — Telehealth: Payer: Self-pay

## 2017-06-02 ENCOUNTER — Other Ambulatory Visit: Payer: Self-pay | Admitting: Medical

## 2017-06-02 LAB — BASIC METABOLIC PANEL
BUN/Creatinine Ratio: 20 (ref 9–23)
BUN: 11 mg/dL (ref 6–20)
CALCIUM: 9.4 mg/dL (ref 8.7–10.2)
CO2: 22 mmol/L (ref 20–29)
CREATININE: 0.54 mg/dL — AB (ref 0.57–1.00)
Chloride: 102 mmol/L (ref 96–106)
GFR calc Af Amer: 140 mL/min/{1.73_m2} (ref >59)
GFR, EST NON AFRICAN AMERICAN: 122 mL/min/{1.73_m2} (ref >59)
Glucose: 82 mg/dL (ref 65–99)
POTASSIUM: 4 mmol/L (ref 3.5–5.2)
SODIUM: 138 mmol/L (ref 134–144)

## 2017-06-02 LAB — CBC WITH DIFFERENTIAL/PLATELET
Basophils Absolute: 0.1 10*3/uL (ref 0.0–0.2)
Basos: 1 %
EOS (ABSOLUTE): 0.4 10*3/uL (ref 0.0–0.4)
EOS: 5 %
HEMATOCRIT: 38.3 % (ref 34.0–46.6)
HEMOGLOBIN: 12.5 g/dL (ref 11.1–15.9)
IMMATURE GRANULOCYTES: 0 %
Immature Grans (Abs): 0 10*3/uL (ref 0.0–0.1)
Lymphocytes Absolute: 2.5 10*3/uL (ref 0.7–3.1)
Lymphs: 32 %
MCH: 29.6 pg (ref 26.6–33.0)
MCHC: 32.6 g/dL (ref 31.5–35.7)
MCV: 91 fL (ref 79–97)
MONOCYTES: 9 %
Monocytes Absolute: 0.7 10*3/uL (ref 0.1–0.9)
NEUTROS PCT: 53 %
Neutrophils Absolute: 4.2 10*3/uL (ref 1.4–7.0)
Platelets: 317 10*3/uL (ref 150–379)
RBC: 4.22 x10E6/uL (ref 3.77–5.28)
RDW: 13.6 % (ref 12.3–15.4)
WBC: 7.9 10*3/uL (ref 3.4–10.8)

## 2017-06-02 LAB — LIPID PANEL
CHOL/HDL RATIO: 2.2 ratio (ref 0.0–4.4)
Cholesterol, Total: 144 mg/dL (ref 100–199)
HDL: 65 mg/dL (ref >39)
LDL CALC: 67 mg/dL (ref 0–99)
TRIGLYCERIDES: 60 mg/dL (ref 0–149)
VLDL Cholesterol Cal: 12 mg/dL (ref 5–40)

## 2017-06-02 LAB — VITAMIN D 25 HYDROXY (VIT D DEFICIENCY, FRACTURES): Vit D, 25-Hydroxy: 28.8 ng/mL — ABNORMAL LOW (ref 30.0–100.0)

## 2017-06-02 MED ORDER — VITAMIN D (ERGOCALCIFEROL) 1.25 MG (50000 UNIT) PO CAPS: 50000.0000 [IU] | ORAL_CAPSULE | ORAL | 3 refills | Status: DC

## 2017-06-02 NOTE — Telephone Encounter (Signed)
-----   Message from Jac Canavanavid S Tysinger, PA-C sent at 06/02/2017  7:47 AM EDT ----- Labs fine except vit D.  Vitamin D was low.  I would like them to begin prescription Vitamin D/Ergocalciferol 50,000 units, 1 tablet weekly for 3 months.  We can recheck Vit D level at that time.   Foods that contain vitamin D include seafood such as oysters, shrimp, salmon, herring, cod, egg yolks, mushrooms, milk, and foods fortified with Vitamin D such as orange juice, cereals.  Its also important to get some sun exposure regularly to absorb vitamin D.  After 3 months, change to to Vitamin D 2000 u daily.  F/u yearly for physical

## 2017-06-02 NOTE — Telephone Encounter (Signed)
Called patient and advised of lab work. Patient understood, will began medication and will call back for nurse visit.

## 2017-11-27 ENCOUNTER — Ambulatory Visit: Payer: Managed Care, Other (non HMO) | Admitting: Family Medicine

## 2017-11-27 ENCOUNTER — Encounter: Payer: Self-pay | Admitting: Family Medicine

## 2017-11-27 VITALS — BP 118/70 | HR 79 | Temp 98.3°F | Resp 16 | Wt 175.6 lb

## 2017-11-27 DIAGNOSIS — R531 Weakness: Secondary | ICD-10-CM | POA: Insufficient documentation

## 2017-11-27 DIAGNOSIS — R9431 Abnormal electrocardiogram [ECG] [EKG]: Secondary | ICD-10-CM | POA: Diagnosis not present

## 2017-11-27 DIAGNOSIS — R55 Syncope and collapse: Secondary | ICD-10-CM | POA: Insufficient documentation

## 2017-11-27 DIAGNOSIS — R42 Dizziness and giddiness: Secondary | ICD-10-CM | POA: Diagnosis not present

## 2017-11-27 DIAGNOSIS — R000 Tachycardia, unspecified: Secondary | ICD-10-CM | POA: Diagnosis not present

## 2017-11-27 LAB — POCT URINE PREGNANCY: Preg Test, Ur: NEGATIVE

## 2017-11-27 LAB — GLUCOSE, POCT (MANUAL RESULT ENTRY): POC Glucose: 115 mg/dl — AB (ref 70–99)

## 2017-11-27 NOTE — Patient Instructions (Signed)
You will receive a call from the cardiology office to schedule an appointment.   If you have any new or worsening symptoms in the meantime, go to the emergency department. These would include chest pain, worsening dizziness, fast or abnormal heart rate etc.

## 2017-11-27 NOTE — Progress Notes (Signed)
   Subjective:    Patient ID: Kimberly SolianNabila Brock, female    DOB: 1980-07-01, 37 y.o.   MRN: [id number]  HPI Chief Complaint  Patient presents with  . dizzy    dizzy yesterday and had lingered on, and heart beating fast.no vision issues, no HA , no chest pain   She is here with complaints of a 2 day history of intermittent heart racing, near syncope, dizziness and generalized weakness. States symptoms occur at random times including when sitting or sitting and not with exertion.  Reports symptoms last approximately a couple of minutes and then resolve spontaneously. Reports yesterday her "vision went black" for 5 seconds and then resolved. Normal vision since.   Nothing seems to aggravate or alleviate symptoms.  Denies history of the same. Denies chest pain, shortness of breath. Last episode occurred a few minutes ago and she then came straight here.   Denies fever, chills, headache, blurred or double vision, abdominal pain, N/V/D, urinary symptoms.   Denies personal or family history of cardiac disease.   Does not smoke or drink alcohol. Denies any new medications or herbal supplements. No change in caffeine intake.   LMP: started yesterday   Reviewed allergies, medications, past medical, surgical, family, and social history.     Review of Systems Pertinent positives and negatives in the history of present illness.     Objective:   Physical Exam BP 118/70 (BP Location: Right Arm, Patient Position: Standing, Cuff Size: Large)   Pulse 79   Temp 98.3 F (36.8 C) (Oral)   Resp 16   Wt 175 lb 9.6 oz (79.7 kg)   SpO2 98%   BMI 27.10 kg/m   Alert and oriented and in no distress. Tympanic membranes and canals are normal. Pharyngeal area is normal. Neck is supple without adenopathy or thyromegaly. Cardiac exam shows a regular sinus rhythm without murmurs or gallops. Lungs are clear to auscultation. Abdomen is soft, non distended, non tender with normal bowel sounds. Extremities without  edema, distal pulses intact. Skin is warm and dry, no pallor or rash. PERRLA, EOMs intact. No facial asymmetry, normal finger to nose and heel to shin. No pronator drift. CN II-IX intact. DTRs symmetric and normal. Romberg negative.        Assessment & Plan:  Dizziness - Plan: Visual acuity screening, POCT glucose (manual entry), TSH, T4, free, EKG 12-Lead, POCT urine pregnancy, CBC with Differential/Platelet, Comprehensive metabolic panel, Ambulatory referral to Cardiology  Generalized weakness - Plan: POCT glucose (manual entry), TSH, T4, free, EKG 12-Lead, POCT urine pregnancy, CBC with Differential/Platelet, Comprehensive metabolic panel, Ambulatory referral to Cardiology  Racing heart beat - Plan: TSH, T4, free, EKG 12-Lead, POCT urine pregnancy, CBC with Differential/Platelet, Comprehensive metabolic panel, Ambulatory referral to Cardiology  Abnormal ECG - Plan: Ambulatory referral to Cardiology  Near syncope - Plan: Ambulatory referral to Cardiology  She is not postural. Normal neurological exam.  Normal visual acuity.  POCT 115 Negative UPT ECG with marked sinus arrhythmia and PVC. Nonspecific T wave abnormality.  Refer to cardiology for near syncope and palpitations along with ECG abnormalities.  Check labs and follow up pending cardiology referral. She will go to the ED if she notices any new or worsening symptoms in the meantime.

## 2017-11-28 LAB — CBC WITH DIFFERENTIAL/PLATELET
Basophils Absolute: 0.1 10*3/uL (ref 0.0–0.2)
Basos: 2 %
EOS (ABSOLUTE): 0.5 10*3/uL — ABNORMAL HIGH (ref 0.0–0.4)
EOS: 8 %
HEMATOCRIT: 40.6 % (ref 34.0–46.6)
HEMOGLOBIN: 13.3 g/dL (ref 11.1–15.9)
IMMATURE GRANULOCYTES: 0 %
Immature Grans (Abs): 0 10*3/uL (ref 0.0–0.1)
LYMPHS: 35 %
Lymphocytes Absolute: 2.1 10*3/uL (ref 0.7–3.1)
MCH: 30.4 pg (ref 26.6–33.0)
MCHC: 32.8 g/dL (ref 31.5–35.7)
MCV: 93 fL (ref 79–97)
MONOCYTES: 10 %
MONOS ABS: 0.6 10*3/uL (ref 0.1–0.9)
NEUTROS PCT: 45 %
Neutrophils Absolute: 2.8 10*3/uL (ref 1.4–7.0)
Platelets: 302 10*3/uL (ref 150–450)
RBC: 4.37 x10E6/uL (ref 3.77–5.28)
RDW: 11.7 % — AB (ref 12.3–15.4)
WBC: 6.1 10*3/uL (ref 3.4–10.8)

## 2017-11-28 LAB — COMPREHENSIVE METABOLIC PANEL
ALT: 11 IU/L (ref 0–32)
AST: 9 IU/L (ref 0–40)
Albumin/Globulin Ratio: 2 (ref 1.2–2.2)
Albumin: 4.9 g/dL (ref 3.5–5.5)
Alkaline Phosphatase: 72 IU/L (ref 39–117)
BUN/Creatinine Ratio: 14 (ref 9–23)
BUN: 10 mg/dL (ref 6–20)
Bilirubin Total: 0.6 mg/dL (ref 0.0–1.2)
CALCIUM: 10 mg/dL (ref 8.7–10.2)
CO2: 25 mmol/L (ref 20–29)
CREATININE: 0.69 mg/dL (ref 0.57–1.00)
Chloride: 101 mmol/L (ref 96–106)
GFR calc Af Amer: 130 mL/min/{1.73_m2} (ref >59)
GFR, EST NON AFRICAN AMERICAN: 112 mL/min/{1.73_m2} (ref >59)
GLOBULIN, TOTAL: 2.5 g/dL (ref 1.5–4.5)
Glucose: 98 mg/dL (ref 65–99)
Potassium: 3.8 mmol/L (ref 3.5–5.2)
SODIUM: 143 mmol/L (ref 134–144)
Total Protein: 7.4 g/dL (ref 6.0–8.5)

## 2017-11-28 LAB — T4, FREE: Free T4: 1.3 ng/dL (ref 0.82–1.77)

## 2017-11-28 LAB — TSH: TSH: 1.04 u[IU]/mL (ref 0.450–4.500)

## 2017-11-29 ENCOUNTER — Encounter (HOSPITAL_COMMUNITY): Payer: Self-pay | Admitting: Emergency Medicine

## 2017-11-29 ENCOUNTER — Emergency Department (HOSPITAL_COMMUNITY): Payer: Managed Care, Other (non HMO)

## 2017-11-29 ENCOUNTER — Emergency Department (HOSPITAL_COMMUNITY)
Admission: EM | Admit: 2017-11-29 | Discharge: 2017-11-29 | Disposition: A | Discharge status: Home / Self Care | Payer: Managed Care, Other (non HMO) | Attending: Emergency Medicine | Admitting: Emergency Medicine

## 2017-11-29 DIAGNOSIS — Z79899 Other long term (current) drug therapy: Secondary | ICD-10-CM | POA: Insufficient documentation

## 2017-11-29 DIAGNOSIS — R42 Dizziness and giddiness: Secondary | ICD-10-CM

## 2017-11-29 DIAGNOSIS — R0789 Other chest pain: Secondary | ICD-10-CM | POA: Insufficient documentation

## 2017-11-29 DIAGNOSIS — H7292 Unspecified perforation of tympanic membrane, left ear: Secondary | ICD-10-CM | POA: Diagnosis not present

## 2017-11-29 DIAGNOSIS — R079 Chest pain, unspecified: Secondary | ICD-10-CM | POA: Diagnosis present

## 2017-11-29 LAB — I-STAT TROPONIN, ED
TROPONIN I, POC: 0 ng/mL (ref 0.00–0.08)
Troponin i, poc: 0 ng/mL (ref 0.00–0.08)

## 2017-11-29 LAB — CBC
HCT: 42.8 % (ref 36.0–46.0)
Hemoglobin: 13.6 g/dL (ref 12.0–15.0)
MCH: 30.6 pg (ref 26.0–34.0)
MCHC: 31.8 g/dL (ref 30.0–36.0)
MCV: 96.4 fL (ref 78.0–100.0)
PLATELETS: 295 10*3/uL (ref 150–400)
RBC: 4.44 MIL/uL (ref 3.87–5.11)
RDW: 12.5 % (ref 11.5–15.5)
WBC: 6.2 10*3/uL (ref 4.0–10.5)

## 2017-11-29 LAB — BASIC METABOLIC PANEL
ANION GAP: 7 (ref 5–15)
BUN: 9 mg/dL (ref 6–20)
CALCIUM: 9.7 mg/dL (ref 8.9–10.3)
CO2: 28 mmol/L (ref 22–32)
Chloride: 106 mmol/L (ref 98–111)
Creatinine, Ser: 0.62 mg/dL (ref 0.44–1.00)
GLUCOSE: 99 mg/dL (ref 70–99)
Potassium: 3.7 mmol/L (ref 3.5–5.1)
Sodium: 141 mmol/L (ref 135–145)

## 2017-11-29 LAB — I-STAT BETA HCG BLOOD, ED (MC, WL, AP ONLY): I-stat hCG, quantitative: <5 m[IU]/mL (ref <5)

## 2017-11-29 LAB — D-DIMER, QUANTITATIVE: D-Dimer, Quant: 0.53 ug/mL-FEU — ABNORMAL HIGH (ref 0.00–0.50)

## 2017-11-29 MED ORDER — SODIUM CHLORIDE 0.9 % IV BOLUS
1000.0000 mL | Freq: Once | INTRAVENOUS | Status: AC
  Administered 2017-11-29: 1000 mL via INTRAVENOUS

## 2017-11-29 MED ORDER — MECLIZINE HCL 25 MG PO TABS
25.0000 mg | ORAL_TABLET | Freq: Once | ORAL | Status: AC
  Administered 2017-11-29: 25 mg via ORAL
  Filled 2017-11-29: qty 1

## 2017-11-29 MED ORDER — MECLIZINE HCL 25 MG PO TABS: 25.0000 mg | ORAL_TABLET | Freq: Once | ORAL | Status: DC

## 2017-11-29 MED ORDER — IOPAMIDOL (ISOVUE-370) INJECTION 76%
100.0000 mL | Freq: Once | INTRAVENOUS | Status: AC | PRN
  Administered 2017-11-29: 100 mL via INTRAVENOUS

## 2017-11-29 MED ORDER — IOPAMIDOL (ISOVUE-370) INJECTION 76%
INTRAVENOUS | Status: AC
  Filled 2017-11-29: qty 100

## 2017-11-29 NOTE — ED Triage Notes (Signed)
Pt presents to ED for assessment of intermittent episodes lasting 2-5 seconds of dizziness and generalized weakness since Saturday.  Patient states starting today she started having left sided chest pain.  C/o dizziness, nausea, and light-headedness.  Denies radiation or SOB.

## 2017-11-29 NOTE — Discharge Instructions (Signed)
If your symptoms worsen or you have additional concerns please seek additional medical care and evaluation.  Please follow-up with your primary care provider in the next 1 to 2 days.  I have given you a referral to an ear nose and throat doctor to evaluate for your ruptured eardrum.  Please make sure that you get wax earplugs from the drugstore and use these while showering to avoid getting water into your ear.  Your EKG showed occasional abnormal beats, however this is not concerning at this time.  Your CT scan of your chest did not show any blood clots.

## 2017-11-29 NOTE — ED Provider Notes (Signed)
[facility] Provider [facility]   CSN: [id number] Arrival date & time: 11/29/17  1103     History   Chief Complaint Chief Complaint  Patient presents with  . Chest Pain    HPI Kimberly Brock is a 37 y.o. female with a past medical history who presents today for evaluation of near syncope.  She was seen by her PCP on 9/23 for this.  On Sunday she had a episode where she felt like her heart was racing, weak, dizzy, and her vision went black for a few seconds.  She did not pass out.  She says this resolved spontaneously after under a minute.  She got nauseous with this.  She went to her PCP on Monday where they felt that she had an abnormal EKG with sinus arrhythmia and PVCs and gave her a referral to cardiology, she has not seen them yet.  She reports that she has had a few more of these episodes, they occur at random times including at rest, not with exertion.  She says that this morning for about an hour she had left-sided chest pain and back pain with worsened nausea.  She denies diaphoresis, cough, or recent illness.  When asked to clarify dizziness she says that she does not feel like the room is spinning or like she is going to pass out is that she feels slightly off balance.  She denies difficulties walking.  She denies hemoptysis.  Does have a mirena IUD.   HPI  Past Medical History:  Diagnosis Date  . Allergy   . Female circumcision     Patient Active Problem List   Diagnosis Date Noted  . Racing heart beat 11/27/2017  . Dizziness 11/27/2017  . Generalized weakness 11/27/2017  . Abnormal ECG 11/27/2017  . Near syncope 11/27/2017  . Glucosuria with normal serum glucose 02/10/2017  . Vitamin D deficiency 05/24/2016  . Screening for cancer 05/24/2016  . Rib pain on left side 05/24/2016  . Encounter for health maintenance examination in adult 07/02/2015  . Routine general medical examination at a health care facility 03/24/2014  . 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 Language barrier to communication 10/09/2013    Past Surgical History:  Procedure Laterality Date  . CIRCUMCISION     female     OB History    Gravida  4   Para  4   Term  4   Preterm      AB      Living  4     SAB      TAB      Ectopic      Multiple      Live Births  4            Home Medications    Prior to Admission medications   Medication Sig Start Date End Date Taking? Authorizing Provider  Cetirizine HCl (ALL DAY ALLERGY) 10 MG CAPS Take 10 mg by mouth daily.    Yes [provider]  Vitamin D, Ergocalciferol, (DRISDOL) 50000 units CAPS capsule Take 1 capsule (50,000 Units total) by mouth every 7 (seven) days. 06/02/17  Yes Tysinger, Kermit Baloavid S, PA-C    Family History Family History  Problem Relation Age of Onset  . Cancer Neg Hx   . Stroke Neg Hx   . Heart disease Neg Hx   . Diabetes Neg Hx   . Hyperlipidemia Neg Hx   . Hypertension Neg Hx     Social  History Social History   Tobacco Use  . Smoking status: Never Smoker  . Smokeless tobacco: Never Used  Substance Use Topics  . Alcohol use: No  . Drug use: No     Allergies   Patient has no known allergies.   Review of Systems Review of Systems  Constitutional: Positive for fatigue. Negative for chills and fever.  HENT: Negative for congestion and ear pain.   Eyes: Positive for visual disturbance.  Cardiovascular: Positive for chest pain.  Gastrointestinal: Positive for nausea.  Genitourinary: Negative for vaginal bleeding and vaginal discharge.  Musculoskeletal: Positive for back pain.  Neurological: Positive for dizziness and weakness (Eye lids feel weak when looking up). Negative for seizures, facial asymmetry, speech difficulty, light-headedness and headaches.  All other systems reviewed and are negative.    Physical Exam Updated Vital Signs BP 104/76   Pulse 78   Temp 99 F (37.2 C)   Resp 16   LMP 11/29/2017   SpO2 100%    Physical Exam  Constitutional: She appears well-developed and well-nourished. No distress.  HENT:  Head: Normocephalic and atraumatic.  Right Ear: Tympanic membrane, external ear and ear canal normal.  Left Ear: External ear and ear canal normal. Tympanic membrane is perforated.  Ears:  Eyes: Pupils are equal, round, and reactive to light. Conjunctivae and EOM are normal.  Neck: Neck supple.  Cardiovascular: Normal rate, regular rhythm and normal pulses.  No murmur heard. Pulmonary/Chest: Effort normal and breath sounds normal. No tachypnea. No respiratory distress.  Abdominal: Soft. Bowel sounds are normal. She exhibits no distension. There is no tenderness.  Musculoskeletal: She exhibits no edema.       Right lower leg: Normal. She exhibits no tenderness and no edema.       Left lower leg: Normal. She exhibits no tenderness and no edema.  No chest TTP, no TTP over back.    Neurological: She is alert.  Mental Status:  Alert, oriented, thought content appropriate, able to give a coherent history. Speech fluent without evidence of aphasia. Able to follow 2 step commands without difficulty.  Cranial Nerves:  II:  Peripheral visual fields grossly normal, pupils equal, round, reactive to light III,IV, VI: ptosis not present, extra-ocular motions intact bilaterally  V,VII: smile symmetric, facial light touch sensation equal VIII: hearing grossly normal to voice  X: uvula elevates symmetrically  XI: bilateral shoulder shrug symmetric and strong XII: midline tongue extension without fassiculations Motor:  Normal tone. 5/5 in upper and lower extremities bilaterally including strong and equal grip strength and dorsiflexion/plantar flexion Cerebellar: normal finger-to-nose with bilateral upper extremities Gait: normal gait and balance CV: distal pulses palpable throughout    Skin: Skin is warm and dry.  Psychiatric: She has a normal mood and affect.  Nursing note and vitals  reviewed.    ED Treatments / Results  Labs (all labs ordered are listed, but only abnormal results are displayed) Labs Reviewed  D-DIMER, QUANTITATIVE (NOT AT [facility]) - Abnormal; Notable for the following components:      Result Value   D-Dimer, Quant 0.53 (*)    All other components within normal limits  BASIC METABOLIC PANEL  CBC  I-STAT TROPONIN, ED  I-STAT BETA HCG BLOOD, ED ([facility], [facility], [facility] ONLY)  I-STAT TROPONIN, ED  I-STAT TROPONIN, ED    EKG EKG Interpretation  Date/Time:  Wednesday November 29 2017 11:16:51 EDT Ventricular Rate:  72 PR Interval:  140 QRS Duration: 70 QT Interval:  374 QTC Calculation: 409 R  Axis:   65 Text Interpretation:  Sinus rhythm with Premature atrial complexes Otherwise normal ECG Confirmed by Loren Racer ([id number]) on 11/29/2017 7:14:25 PM   Radiology Dg Chest 2 View  Result Date: 11/29/2017 CLINICAL DATA:  Chest pain EXAM: CHEST - 2 VIEW COMPARISON:  11/23/2007 FINDINGS: The heart size and mediastinal contours are within normal limits. Both lungs are clear. The visualized skeletal structures are unremarkable. IMPRESSION: No active cardiopulmonary disease. Electronically Signed   By: Alcide Clever M.D.   On: 11/29/2017 12:00   Ct Angio Chest Pe W/cm &/or Wo Cm  Result Date: 11/29/2017 CLINICAL DATA:  Left chest pain, dizziness EXAM: CT ANGIOGRAPHY CHEST WITH CONTRAST TECHNIQUE: Multidetector CT imaging of the chest was performed using the standard protocol during bolus administration of intravenous contrast. Multiplanar CT image reconstructions and MIPs were obtained to evaluate the vascular anatomy. CONTRAST:  ISOVUE-370 IOPAMIDOL (ISOVUE-370) INJECTION 76% COMPARISON:  None. FINDINGS: Cardiovascular: No filling defects in the pulmonary arteries to suggest pulmonary emboli. Heart is normal size. Aorta is normal caliber. Persistent/duplicated left SVC. Mediastinum/Nodes: No mediastinal, hilar, or axillary adenopathy. Lungs/Pleura: Lungs are  clear. No focal airspace opacities or suspicious nodules. No effusions. Upper Abdomen: Imaging into the upper abdomen shows no acute findings. Musculoskeletal: Chest wall soft tissues are unremarkable. No acute bony abnormality. Review of the MIP images confirms the above findings. IMPRESSION: No evidence of pulmonary embolus.  No acute cardiopulmonary disease. Electronically Signed   By: Charlett Nose M.D.   On: 11/29/2017 17:59    Procedures Procedures (including critical care time)  Medications Ordered in ED Medications  meclizine (ANTIVERT) tablet 25 mg (25 mg Oral Given 11/29/17 1622)  sodium chloride 0.9 % bolus 1,000 mL (0 mLs Intravenous Stopped 11/29/17 1740)  iopamidol (ISOVUE-370) 76 % injection 100 mL (100 mLs Intravenous Contrast Given 11/29/17 1734)     Initial Impression / Assessment and Plan / ED Course  I have reviewed the triage vital signs and the nursing notes.  Pertinent labs & imaging results that were available during my care of the patient were reviewed by me and considered in my medical decision making (see chart for details).    Kimberly Brock presents today for evaluation of chest pain in the setting of multiple presyncopal events.  On the monitor she shows occasional PACs and PVCs.  She reports that the pain has gone away.  Unable to apply PERC criteria due to hormone use, will obtain d-dimer.    She reports that her dizziness is worse with movement.  She feels dizzy when laid down with head to the left, however no nystagmus.  Left TM is perforated.    D-dimer was minimally elevated.  Shared decision-making with patient who elected for CT a chest to rule out PE.  CT angios chest was obtained without evidence of PE or other abnormalities.  Troponins x2 normal.  Patient was given fluids, was not orthostatic, and her symptoms did not improve with meclizine.  She is otherwise neurologically intact without dysmetria or balance difficulties when walking.  Suspect that her  subjective dizziness may be related to her perforated TM on the left side.  ENT follow-up given.   This patient was discussed with Dr. Ranae Palms.  Suspect musculoskeletal source of pain.    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

## 2017-12-05 ENCOUNTER — Ambulatory Visit: Payer: Managed Care, Other (non HMO) | Admitting: Medical

## 2017-12-05 VITALS — BP 110/70 | HR 80 | Temp 98.3°F | Resp 16 | Ht 67.5 in | Wt 173.4 lb

## 2017-12-05 DIAGNOSIS — R42 Dizziness and giddiness: Secondary | ICD-10-CM | POA: Diagnosis not present

## 2017-12-05 DIAGNOSIS — R079 Chest pain, unspecified: Secondary | ICD-10-CM | POA: Diagnosis not present

## 2017-12-05 NOTE — Progress Notes (Signed)
Subjective: Chief Complaint  Patient presents with  . hospital follow up    hospital follow up chest pain    Here for hospital follow up on chest pain.  Here with her husband today  She was seen in the emergency department on September 25 for chest pain, in the setting of multiple presyncopal events.  D-dimer was minimally elevated.  A chest CT was done.  No PE or other abnormalities found.  Troponin is normal.  She was found to have a perforated TM on the left side.  It was felt that her chest pain was musculoskeletal  She is still having lightheaded feeling, not vertigo feeling.   Still has chest pain but improved 50 %.  She denies personal or family history of DVT, PE, MI, CVA, or other blood clot or miscarriage  No other aggravating or relieving factors. No other complaint.   Past Medical History:  Diagnosis Date  . Allergy   . Female circumcision    Current Outpatient Medications on File Prior to Visit  Medication Sig Dispense Refill  . Cetirizine HCl (ALL DAY ALLERGY) 10 MG CAPS Take 10 mg by mouth daily.     . Vitamin D, Ergocalciferol, (DRISDOL) 50000 units CAPS capsule Take 1 capsule (50,000 Units total) by mouth every 7 (seven) days. 12 capsule 3   No current facility-administered medications on file prior to visit.    ROS as in subjective   Objective: BP 110/70   Pulse 80   Temp 98.3 F (36.8 C) (Oral)   Resp 16   Ht 5' 7.5" (1.715 m)   Wt 173 lb 6.4 oz (78.7 kg)   LMP 11/29/2017   SpO2 98%   BMI 26.76 kg/m   Wt Readings from Last 3 Encounters:  12/05/17 173 lb 6.4 oz (78.7 kg)  11/27/17 175 lb 9.6 oz (79.7 kg)  06/01/17 170 lb 6.4 oz (77.3 kg)   BP Readings from Last 3 Encounters:  12/05/17 110/70  11/29/17 104/76  11/27/17 118/70   Gen: wd, wn, nad There appears to be possible perforations of both TMs although is not completely clear, otherwise TMs are normal-appearing HEENT otherwise normal Cardiac regular rate and rhythm although there is a few  skipped beats, no murmur Lungs clear Pulses within normal limits No calf tenderness no chest wall tenderness today Neuro: CN II through XII intact nonfocal exam    Assessment: Encounter Diagnoses  Name Primary?  . Chest pain, unspecified type Yes  . Lightheaded       Plan: In regards to chest pain, I reviewed her emergency department note from 11/29/2017, chest CT negative for PE or other cardiopulmonary disease.  D-dimer was slightly elevated, troponin was normal, negative hCG, normal CBC, normal BMET lab.  Not sure what to make of her symptoms.  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  We will request records from ENT.  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  Recent labs other than d-dimer were normal.  D-dimer was mildly elevated.  Troponins are normal.  She already  has a cardiology appointment tomorrow at [facility], [place]  I printed out her recent EKGs, CT report and labs that she can take with her for her appointment tomorrow for further evaluation  Kimberly Brock was seen today for hospital follow up.  Diagnoses and all orders for this visit:  Chest pain, unspecified type  Lightheaded  Follow-up pending cardiology and ear nose and throat records

## 2017-12-06 ENCOUNTER — Ambulatory Visit: Payer: Managed Care, Other (non HMO) | Admitting: Cardiology

## 2017-12-06 ENCOUNTER — Encounter: Payer: Self-pay | Admitting: Cardiology

## 2017-12-06 VITALS — BP 112/62 | HR 81 | Ht 67.0 in | Wt 171.1 lb

## 2017-12-06 DIAGNOSIS — R002 Palpitations: Secondary | ICD-10-CM | POA: Diagnosis not present

## 2017-12-06 DIAGNOSIS — R079 Chest pain, unspecified: Secondary | ICD-10-CM | POA: Diagnosis not present

## 2017-12-06 NOTE — Patient Instructions (Addendum)
Medication Instructions: Your Physician recommend you make the following changes to your medication. -You may take Ibuprofen 600 mg 3 times a day for 1 week.    If you need a refill on your cardiac medications before your next appointment, please call your pharmacy.   Labwork: Your physician recommends that you return for lab work Today ( ESR, CRP)   Procedures/Testing: Our physician has recommended that you wear an 7 DAY ZIO-PATCH monitor. The Zio patch cardiac monitor continuously records heart rhythm data for up to 14 days, this is for patients being evaluated for multiple types heart rhythms. For the first 24 hours post application, please avoid getting the Zio monitor wet in the shower or by excessive sweating during exercise. After that, feel free to carry on with regular activities. Keep soaps and lotions away from the ZIO XT Patch.   This will be placed at [place]: Your physician wants you to follow-up as needed with Dr. Harrell Gave  Special Instructions:    Thank you for choosing [facility] at [facility]!!

## 2017-12-06 NOTE — Progress Notes (Signed)
Cardiology Office Note:    Date:  12/06/2017   ID:  Kimberly Brock, DOB 01/23/1981, MRN [id number]  PCP:  Carlena Hurl, PA-C  Cardiologist:  Buford Dresser, MD PhD  Referring MD: Girtha Rm, NP-C   CC: chest pain, palpitations  History of Present Illness:    Kimberly Brock is a 37 y.o. female with without significant PMH who is seen as a new consult at the request of Raenette Rover, Vickie L, NP-C for the evaluation and management of chest pain. She was seen in the ER for chest pain on 11/29/17. Negative CT for PE, negative troponins. Treated for MSK pain.  She is here today with her friend. She does speak [place], but her friend also assists with interpretation when she is unclear what to say.   Patient concerns: One week ago, she felt lightheaded, presyncopal, felt like her vision went out for a second. No full syncope. Felt weak, had chest pain nearly continuously. She went to ER after several days. Entire chest felt heavy. Better with resting/lying down, same sensation with deep breath. Sometimes feels more right sided than left. Has felt palpitations as well, which is new for her. Occurs daily, usually more in the afternoon. No recent colds, illnesses or travel. Never had anything like this before.   Dizzyness has improved, but chest heaviness has continued. Palpitations somewhat improved. No shortness of breath, PND, orthopnea. No LE edema.  Past Medical History:  Diagnosis Date  . Allergy   . Female circumcision     Past Surgical History:  Procedure Laterality Date  . CIRCUMCISION     female    Current Medications: Current Outpatient Medications on File Prior to Visit  Medication Sig  . Cetirizine HCl (ALL DAY ALLERGY) 10 MG CAPS Take 10 mg by mouth daily.   . Vitamin D, Ergocalciferol, (DRISDOL) 50000 units CAPS capsule Take 1 capsule (50,000 Units total) by mouth every 7 (seven) days.   No current facility-administered medications on file prior to visit.      Allergies:   Patient has no known allergies.   Social History   Socioeconomic History  . Marital status: Married    Spouse name: Not on file  . Number of children: 4  . Years of education: 40  . Highest education level: Not on file  Occupational History  . Not on file  Social Needs  . Financial resource strain: Not on file  . Food insecurity:    Worry: Not on file    Inability: Not on file  . Transportation needs:    Medical: Not on file    Non-medical: Not on file  Tobacco Use  . Smoking status: Never Smoker  . Smokeless tobacco: Never Used  Substance and Sexual Activity  . Alcohol use: No  . Drug use: No  . Sexual activity: Yes    Birth control/protection: IUD, None  Lifestyle  . Physical activity:    Days per week: Not on file    Minutes per session: Not on file  . Stress: Not on file  Relationships  . Social connections:    Talks on phone: Not on file    Gets together: Not on file    Attends religious service: Not on file    Active member of club or organization: Not on file    Attends meetings of clubs or organizations: Not on file    Relationship status: Not on file  Other Topics Concern  . Not on file  Social History  Narrative   Married to [employer]   Has 4 children,  exercises some.   From [place]    As of 05/2017     Family History: The patient's family history is negative for Cancer, Stroke, Heart disease, Diabetes, Hyperlipidemia, and Hypertension.  ROS:   Please see the history of present illness.  Additional pertinent ROS:  Constitutional: Negative for chills, fever, night sweats, unintentional weight loss. Positive for fatigue. HENT: Negative for ear pain and hearing loss.   Eyes: Negative for loss of vision and eye pain.  Respiratory: Negative for cough, sputum, shortness of breath, wheezing.   Cardiovascular: Positive for chest heaviness, palpitations. Negative for PND, orthopnea, lower extremity edema and claudication.   Gastrointestinal: Negative for abdominal pain, melena, and hematochezia.  Genitourinary: Negative for dysuria and hematuria.  Musculoskeletal: Negative for falls and myalgias.  Skin: Negative for itching and rash.  Neurological: Negative for focal weakness, focal sensory changes and loss of consciousness.  Endo/Heme/Allergies: Does not bruise/bleed easily.    EKGs/Labs/Other Studies Reviewed:    The following studies were reviewed today: Recent ER notes and workup, recent PCP note.  EKG:  EKG is ordered today.  The ekg ordered today demonstrates sinus rhythm with PACs and PVC.  Recent Labs: 11/27/2017: ALT 11; TSH 1.040 11/29/2017: BUN 9; Creatinine, Ser 0.62; Hemoglobin 13.6; Platelets 295; Potassium 3.7; Sodium 141  Recent Lipid Panel    Component Value Date/Time   CHOL 144 06/01/2017 1345   TRIG 60 06/01/2017 1345   HDL 65 06/01/2017 1345   CHOLHDL 2.2 06/01/2017 1345   CHOLHDL 2.3 05/24/2016 0922   VLDL 21 05/24/2016 0922   LDLCALC 67 06/01/2017 1345    Physical Exam:    VS:  BP 112/62   Pulse 81   Ht _0  (1.702 m)   Wt 171 lb 1.9 oz (77.6 kg)   LMP 11/26/2017   BMI 26.80 kg/m     Wt Readings from Last 3 Encounters:  12/06/17 171 lb 1.9 oz (77.6 kg)  12/05/17 173 lb 6.4 oz (78.7 kg)  11/27/17 175 lb 9.6 oz (79.7 kg)     GEN: Well nourished, well developed in no acute distress HEENT: Normal NECK: No JVD; No carotid bruits LYMPHATICS: No lymphadenopathy CARDIAC: regular rhythm, normal S1 and S2, no murmurs, rubs, gallops. Radial and DP pulses 2+ bilaterally. RESPIRATORY:  Clear to auscultation without rales, wheezing or rhonchi  ABDOMEN: Soft, non-tender, non-distended MUSCULOSKELETAL:  No edema; No deformity  SKIN: Warm and dry NEUROLOGIC:  Alert and oriented x 3 PSYCHIATRIC:  Normal affect   ASSESSMENT:    1. Palpitation   2. Chest pain, unspecified type    PLAN:    Palpitations, chest heaviness: no tenderness to palpation. Not clearly  positional. No clear PR depression on ECG, does have artifact, PACs and PVC. No clear preceding event. Differential includes pleuritis (no PE on ER evaluation), pericarditis, musculoskeletal, costochondroitis. Unclear if PACs/PVCs are new, but she only recently began to sense them.   Given her ectopy, will check an event monitor to make sure she is not having any significant/sustained arrhythmia or high burden than may be affecting her symptoms.  Acute MI and pulmonary embolism ruled out in ER, no pneumothorax or evidence of dissection. No abnormalities seen on CT (persistent left SVC is normal variant).   No clear etiology for her symptoms, but she has been rule out for life threatening causes. Will check inflammatory markers. Recommend supportive care, pain control, anti inflammatories. She should  follow up with PCP in a week if her symptoms do not continue to improve.  Plan for follow up: pending results of monitor, PRN  Medication Adjustments/Labs and Tests Ordered: Current medicines are reviewed at length with the patient today.  Concerns regarding medicines are outlined above.  Orders Placed This Encounter  Procedures  . C-reactive protein  . Sedimentation rate  . LONG TERM MONITOR (3-14 DAYS)  . EKG 12-Lead   No orders of the defined types were placed in this encounter.   Patient Instructions  Medication Instructions: Your Physician recommend you make the following changes to your medication. -You may take Ibuprofen 600 mg 3 times a day for 1 week.    If you need a refill on your cardiac medications before your next appointment, please call your pharmacy.   Labwork: Your physician recommends that you return for lab work Today ( ESR, CRP)   Procedures/Testing: Our physician has recommended that you wear an 7 DAY ZIO-PATCH monitor. The Zio patch cardiac monitor continuously records heart rhythm data for up to 14 days, this is for patients being evaluated for multiple types  heart rhythms. For the first 24 hours post application, please avoid getting the Zio monitor wet in the shower or by excessive sweating during exercise. After that, feel free to carry on with regular activities. Keep soaps and lotions away from the ZIO XT Patch.   This will be placed at [place]: Your physician wants you to follow-up as needed with Dr. Harrell Gave  Special Instructions:    Thank you for choosing [facility] at [facility]!!        Signed, Buford Dresser, MD PhD 12/06/2017 1:20 PM    [facility]

## 2017-12-07 ENCOUNTER — Ambulatory Visit: Payer: Managed Care, Other (non HMO)

## 2017-12-07 DIAGNOSIS — R002 Palpitations: Secondary | ICD-10-CM | POA: Diagnosis not present

## 2017-12-07 DIAGNOSIS — R079 Chest pain, unspecified: Secondary | ICD-10-CM

## 2017-12-07 LAB — C-REACTIVE PROTEIN

## 2017-12-07 LAB — SEDIMENTATION RATE: Sed Rate: 7 mm/hr (ref 0–32)

## 2017-12-11 ENCOUNTER — Ambulatory Visit: Payer: Managed Care, Other (non HMO) | Admitting: Medical

## 2017-12-11 VITALS — BP 110/76 | HR 75 | Temp 98.2°F | Resp 16 | Ht 67.0 in | Wt 175.2 lb

## 2017-12-11 DIAGNOSIS — R42 Dizziness and giddiness: Secondary | ICD-10-CM | POA: Diagnosis not present

## 2017-12-11 DIAGNOSIS — H6590 Unspecified nonsuppurative otitis media, unspecified ear: Secondary | ICD-10-CM

## 2017-12-11 DIAGNOSIS — H9203 Otalgia, bilateral: Secondary | ICD-10-CM | POA: Diagnosis not present

## 2017-12-11 MED ORDER — MECLIZINE HCL 25 MG PO TABS: 25.0000 mg | ORAL_TABLET | Freq: Two times a day (BID) | ORAL | 0 refills | Status: DC

## 2017-12-11 MED ORDER — AMOXICILLIN 875 MG PO TABS: 875.0000 mg | ORAL_TABLET | Freq: Two times a day (BID) | ORAL | 0 refills | Status: DC

## 2017-12-11 NOTE — Patient Instructions (Signed)
Recommendations:  Begin Amoxicillin antibiotic twice daily for 10 days  Use Meclizine tablet for congestion in ears and dizziness, twice daily for a week  Stop Allegra temporarily while you are taking the Meclizine  You can resume Allegra after this week  Drink plenty of water throughout the day  If not much improved over the next 2 weeks, then call back      Otitis Media, Adult Otitis media is redness, soreness, and puffiness (swelling) in the space just behind your eardrum (middle ear). It may be caused by allergies or infection. It often happens along with a cold. Follow these instructions at home:  Take your medicine as told. Finish it even if you start to feel better.  Only take over-the-counter or prescription medicines for pain, discomfort, or fever as told by your doctor.  Follow up with your doctor as told. Contact a doctor if:  You have otitis media only in one ear, or bleeding from your nose, or both.  You notice a lump on your neck.  You are not getting better in 3-5 days.  You feel worse instead of better. Get help right away if:  You have pain that is not helped with medicine.  You have puffiness, redness, or pain around your ear.  You get a stiff neck.  You cannot move part of your face (paralysis).  You notice that the bone behind your ear hurts when you touch it. This information is not intended to replace advice given to you by your health care provider. Make sure you discuss any questions you have with your health care provider. Document Released: 08/10/2007 Document Revised: 07/30/2015 Document Reviewed: 09/18/2012 Elsevier Interactive Patient Education  2017 [employer].

## 2017-12-11 NOTE — Progress Notes (Signed)
Subjective:   Kimberly Brock is a 37 y.o. female who presents with ear pain and possible ear infection.   I saw her last week for follow up on chest pain.   She has since seen cardiology, has Holter monitor currently.  She notes recent problems with ongoing dizziness, but over the weekend has been having ear pain.  She reports ear pain, dizziness, some nasal congestion.   No fever, no nausea or vomiting, no cough, no wheezing, no headache, no nosebleed.  Using over-the-counter Allegra daily.  No other aggravating or relieving factors.  No other c/o.  Past Medical History:  Diagnosis Date  . Allergy   . Female circumcision     ROS as in subjective   Objective: BP 110/76   Pulse 75   Temp 98.2 F (36.8 C) (Oral)   Resp 16   Ht 5\' 7"  (1.702 m)   Wt 175 lb 3.2 oz (79.5 kg)   LMP 11/29/2017   SpO2 98%   BMI 27.44 kg/m    General appearance: Alert, WD/WN, no distress, mildly ill appearing                             Skin: warm, no rash                           Head: no sinus tenderness                            Eyes: conjunctiva normal, corneas clear, PERRLA                            Ears: bilat TMs with serous fluid behind TMs, both TMs appear to have round defined outline suggestive of perforation, but no clear this represents a true perforation, external ear canals normal                          Nose: septum midline, turbinates mildly swollen, with erythema and no discharge             Mouth/throat: MMM, tongue normal,no pharyngeal erythema                           Neck: supple, no adenopathy, no thyromegaly, nontender                            Assessment  Encounter Diagnoses  Name Primary?  . Serous otitis media, unspecified chronicity, unspecified laterality Yes  . Otalgia of both ears   . Dizziness       Plan: Discussed findings, symptoms and recommendations below.  Briellah was seen today for ear pain.  Diagnoses and all orders for this  visit:  Serous otitis media, unspecified chronicity, unspecified laterality  Otalgia of both ears  Dizziness  Other orders -     amoxicillin (AMOXIL) 875 MG tablet; Take 1 tablet (875 mg total) by mouth 2 (two) times daily. -     meclizine (ANTIVERT) 25 MG tablet; Take 1 tablet (25 mg total) by mouth 2 (two) times daily.    Patient Instructions  Recommendations:  Begin Amoxicillin antibiotic twice daily for 10 days  Use Meclizine tablet for congestion in ears and dizziness,  twice daily for a week  Stop Allegra temporarily while you are taking the Meclizine  You can resume Allegra after this week  Drink plenty of water throughout the day  If not much improved over the next 2 weeks, then call back      Otitis Media, Adult Otitis media is redness, soreness, and puffiness (swelling) in the space just behind your eardrum (middle ear). It may be caused by allergies or infection. It often happens along with a cold. Follow these instructions at home:  Take your medicine as told. Finish it even if you start to feel better.  Only take over-the-counter or prescription medicines for pain, discomfort, or fever as told by your doctor.  Follow up with your doctor as told. Contact a doctor if:  You have otitis media only in one ear, or bleeding from your nose, or both.  You notice a lump on your neck.  You are not getting better in 3-5 days.  You feel worse instead of better. Get help right away if:  You have pain that is not helped with medicine.  You have puffiness, redness, or pain around your ear.  You get a stiff neck.  You cannot move part of your face (paralysis).  You notice that the bone behind your ear hurts when you touch it. This information is not intended to replace advice given to you by your health care provider. Make sure you discuss any questions you have with your health care provider. Document Released: 08/10/2007 Document Revised: 07/30/2015  Document Reviewed: 09/18/2012 Elsevier Interactive Patient Education  2017 [employer].     Patient voiced understanding of diagnosis, recommendations, and treatment plan.  After visit summary given.

## 2017-12-14 ENCOUNTER — Encounter: Payer: Managed Care, Other (non HMO) | Admitting: Medical

## 2017-12-20 ENCOUNTER — Telehealth: Payer: Self-pay | Admitting: Medical

## 2017-12-20 NOTE — Telephone Encounter (Signed)
Left message on voicemail for patient to call back to give [place] the name of ENT doctor she has seen in the past.

## 2017-12-20 NOTE — Telephone Encounter (Signed)
There is obviously a miscommunication or disconnect here.  I do not see an office note from ear nose and throat under media, I do not see it under the regular chart review tab under encounters, and I do not see it under care everywhere.  So not sure which office it was but I do not have access to the office note.  I would like to see the office note though

## 2017-12-20 NOTE — Telephone Encounter (Signed)
[facility]. They sent a fax back stating that they are a Epic practice and all available records are in system and they have no other available records.

## 2017-12-28 NOTE — Telephone Encounter (Signed)
Patient has not called me back to give me the ENT office she would like to go to.

## 2018-01-05 ENCOUNTER — Encounter: Payer: Self-pay | Admitting: Medical

## 2018-01-05 ENCOUNTER — Ambulatory Visit: Payer: Managed Care, Other (non HMO) | Admitting: Medical

## 2018-01-05 ENCOUNTER — Telehealth: Payer: Self-pay | Admitting: Medical

## 2018-01-05 VITALS — BP 110/70 | HR 76 | Temp 97.9°F | Resp 16 | Ht 67.0 in | Wt 176.4 lb

## 2018-01-05 DIAGNOSIS — H6523 Chronic serous otitis media, bilateral: Secondary | ICD-10-CM

## 2018-01-05 DIAGNOSIS — M25562 Pain in left knee: Secondary | ICD-10-CM

## 2018-01-05 DIAGNOSIS — G8929 Other chronic pain: Secondary | ICD-10-CM

## 2018-01-05 DIAGNOSIS — M25561 Pain in right knee: Secondary | ICD-10-CM

## 2018-01-05 DIAGNOSIS — M170 Bilateral primary osteoarthritis of knee: Secondary | ICD-10-CM

## 2018-01-05 MED ORDER — MELOXICAM 15 MG PO TABS: 15.0000 mg | ORAL_TABLET | Freq: Every day | ORAL | 1 refills | Status: DC

## 2018-01-05 NOTE — Telephone Encounter (Signed)
I believe she has seen in [facility] a few months ago for her ear pressure, ear pain and consideration of possible perforated eardrum.  She would like to get an appointment back over there but seems to be having a problem getting in.  It may be a phone/communication issue  Please get her back in over there for follow-up before the end of the year as their insurance will run out or change soon

## 2018-01-05 NOTE — Progress Notes (Signed)
Subjective: Chief Complaint  Patient presents with  . knee pain    bilateral knee pain X 3 weeks   Here for complaint of knee pains x3 weeks.   Daily pain for 3 weeks, but has had intermittent knee pains in the past.   She notes no recent fall, trauma or injury.    Denies swelling, no paresthesias.  Not on knees with activiites.  No hip or ankle pain.   Has been prescribed Mobic in the past through urgent care.   Walks some for exercise.  No other aggravating or relieving factors. No other complaint.  Past Medical History:  Diagnosis Date  . Allergy   . Female circumcision    Current Outpatient Medications on File Prior to Visit  Medication Sig Dispense Refill  . Cetirizine HCl (ALL DAY ALLERGY) 10 MG CAPS Take 10 mg by mouth daily.     . Vitamin D, Ergocalciferol, (DRISDOL) 50000 units CAPS capsule Take 1 capsule (50,000 Units total) by mouth every 7 (seven) days. 12 capsule 3  . meclizine (ANTIVERT) 25 MG tablet Take 1 tablet (25 mg total) by mouth 2 (two) times daily. (Patient not taking: Reported on 01/05/2018) 30 tablet 0   No current facility-administered medications on file prior to visit.    ROS as in subjective   Objective: BP 110/70   Pulse 76   Temp 97.9 F (36.6 C) (Oral)   Resp 16   Ht 5\' 7"  (1.702 m)   Wt 176 lb 6.4 oz (80 kg)   SpO2 99%   BMI 27.63 kg/m   Gen: wd, wn, nad Skin: unremarkable Both TMs with serous fluid behind TMs, questionable perforation left TM MSK: Bilateral knees with anterior medial joint line tenderness, pain with flexion of both knees, otherwise nontender no deformity no laxity normal range of motion, rest of legs unremarkable Ext: no edema Pulses wnl Neuro: normal LE strength and sensation and DTRs     Assessment: Encounter Diagnoses  Name Primary?  . Chronic pain of both knees Yes  . Osteoarthritis of both knees, unspecified osteoarthritis type   . Bilateral chronic serous otitis media      Plan: We discussed her concerns  and exam findings and prior x-ray from 2017.  Begin Mobic/meloxicam 1 tablet daily for the next 7 days, then use this as needed  If the pain is really bad or if the knee is swollen, you can use an ice water pack for 20 minutes while resting the knee  Begin using the elliptical machine at the [facility] 20 to 30 minutes at least 3 days/week  Try to use a little bit of incline on the elliptical machine so you are working up hill  Use leg press machine 2 days/week.  Do 1 legged presses with low weight, 20-30 reps at a time.  Do left leg, right leg, then both legs at the same time.  These exercises are meant to strengthen the legs and knees to help reduce your pain over the next few weeks  If you do not see significant improvements over the next 2 months then we can refer you to orthopedics for other evaluation and treatment  Referral back to [facility] ENT, c/t flonase   Kimberly Brock was seen today for knee pain.  Diagnoses and all orders for this visit:  Chronic pain of both knees  Osteoarthritis of both knees, unspecified osteoarthritis type  Bilateral chronic serous otitis media  Other orders -     meloxicam (MOBIC) 15 MG  tablet; Take 1 tablet (15 mg total) by mouth daily.

## 2018-01-05 NOTE — Telephone Encounter (Signed)
Patient has an appointment today 01-05-18 at 400

## 2018-01-05 NOTE — Patient Instructions (Signed)
Knee pain bilateral   Begin Mobic/meloxicam 1 tablet daily for the next 7 days, then use this as needed  If the pain is really bad or if the knee is swollen, you can use an ice water pack for 20 minutes while resting the knee  Begin using the elliptical machine at the [facility] 20 to 30 minutes at least 3 days/week  Try to use a little bit of incline on the elliptical machine so you are working up hill  Use leg press machine 2 days/week.  Do 1 legged presses with low weight, 20-30 reps at a time.  Do left leg, right leg, then both legs at the same time.  These exercises are meant to strengthen the legs and knees to help reduce your pain over the next few weeks  If you do not see significant improvements over the next 2 months then we can refer you to orthopedics for other evaluation and treatment      Knee Pain, Adult Many things can cause knee pain. The pain often goes away on its own with time and rest. If the pain does not go away, tests may be done to find out what is causing the pain. Follow these instructions at home: Activity  Rest your knee.  Do not do things that cause pain.  Avoid activities where both feet leave the ground at the same time (high-impact activities). Examples are running, jumping rope, and doing jumping jacks. General instructions  Take medicines only as told by your doctor.  Raise (elevate) your knee when you are resting. Make sure your knee is higher than your heart.  Sleep with a pillow under your knee.  If told, put ice on the knee: ? Put ice in a plastic bag. ? Place a towel between your skin and the bag. ? Leave the ice on for 20 minutes, 2-3 times a day.  Ask your doctor if you should wear an elastic knee support.  Lose weight if you are overweight. Being overweight can make your knee hurt more.  Do not use any tobacco products. These include cigarettes, chewing tobacco, or electronic cigarettes. If you need help quitting, ask your doctor.  Smoking may slow down healing. Contact a doctor if:  The pain does not stop.  The pain changes or gets worse.  You have a fever along with knee pain.  Your knee gives out or locks up.  Your knee swells, and becomes worse. Get help right away if:  Your knee feels warm.  You cannot move your knee.  You have very bad knee pain.  You have chest pain.  You have trouble breathing. Summary  Many things can cause knee pain. The pain often goes away on its own with time and rest.  Avoid activities that put stress on your knee. These include running and jumping rope.  Get help right away if you cannot move your knee, or if your knee feels warm, or if you have trouble breathing. This information is not intended to replace advice given to you by your health care provider. Make sure you discuss any questions you have with your health care provider. Document Released: 05/20/2008 Document Revised: 02/16/2016 Document Reviewed: 02/16/2016 Elsevier Interactive Patient Education  2017 [employer].

## 2018-03-07 NOTE — L&D Delivery Note (Signed)
Delivery Note:   [id number] at [redacted]w[redacted]d  Admitting diagnosis: Indication for care in labor or delivery [O75.9], SROM Risks: AMA Onset of labor: 1630 12/27 Augmentation: Pitocin ROM: 1630 12/27, clear AF initially, then MSF noted during labor  Complete dilation at 03/04/2019  0208, anterior lip reduced over 2 contractions Onset of pushing at 0200 FHR second stage category II, variable decels  Analgesia Lorriane Shire intrapartum:Epidural  Pushing in [employer] position with CNM and L&D staff support, FOB Asim present for birth and supportive.  Delivery of a vigorous Live born female, strong cry at birth Birth Weight:  pending APGAR: 9, 9  Newborn Delivery   Birth date/time: 03/04/2019 02:17:00 Delivery type: Vaginal, Spontaneous      in cephalic presentation, position OA to ROT.   Nuchal Cord: No  Cord double clamped after cessation of pulsation, cut by RN.  Collection of cord blood for typing completed. Cord blood donation-None  Arterial cord blood sample-No    Placenta delivered-Spontaneous , Schultz, trailing membranes, with 3 vessels . Appears intact on inspection, central cord insert.  Uterotonics: Pitocin bolus Placenta to L&D for disposal. Uterine tone firm, bleeding light  None  laceration identified.  Episiotomy:None  Local analgesia: NA  Est. Blood Loss (AV):409.81   Complications: None  APGAR:1 min-9 , 5 min-9  10 min-   Mom to postpartum.  Baby Kimberly Brock to Couplet care / Skin to Skin.  Delivery Report:  Review the Delivery Report for details.     Signed: Juliene Pina, CNM, MSN 03/04/2019, 2:33 AM

## 2018-06-04 ENCOUNTER — Encounter: Payer: Managed Care, Other (non HMO) | Admitting: Medical

## 2018-06-07 ENCOUNTER — Other Ambulatory Visit: Payer: Self-pay | Admitting: Medical

## 2018-06-08 NOTE — Telephone Encounter (Signed)
Is this ok to refill?  

## 2018-07-02 ENCOUNTER — Encounter: Payer: Managed Care, Other (non HMO) | Admitting: Medical

## 2018-08-08 LAB — OB RESULTS CONSOLE HIV ANTIBODY (ROUTINE TESTING): HIV: NONREACTIVE

## 2018-08-08 LAB — OB RESULTS CONSOLE RUBELLA ANTIBODY, IGM: Rubella: IMMUNE

## 2018-08-08 LAB — OB RESULTS CONSOLE RPR: RPR: NONREACTIVE

## 2018-08-08 LAB — OB RESULTS CONSOLE HEPATITIS B SURFACE ANTIGEN: Hepatitis B Surface Ag: NEGATIVE

## 2018-08-17 ENCOUNTER — Encounter: Payer: Managed Care, Other (non HMO) | Admitting: Medical

## 2019-02-13 LAB — OB RESULTS CONSOLE GBS: GBS: NEGATIVE

## 2019-03-03 ENCOUNTER — Inpatient Hospital Stay (HOSPITAL_COMMUNITY): Payer: Managed Care, Other (non HMO) | Admitting: Anesthesiology

## 2019-03-03 ENCOUNTER — Inpatient Hospital Stay (HOSPITAL_COMMUNITY)
Admission: AD | Admit: 2019-03-03 | Discharge: 2019-03-05 | DRG: 807 | Disposition: A | Discharge status: Home / Self Care | Payer: Managed Care, Other (non HMO) | Attending: Obstetrics & Gynecology | Admitting: Obstetrics & Gynecology

## 2019-03-03 ENCOUNTER — Encounter (HOSPITAL_COMMUNITY): Payer: Self-pay | Admitting: Obstetrics and Gynecology

## 2019-03-03 ENCOUNTER — Other Ambulatory Visit: Payer: Self-pay

## 2019-03-03 DIAGNOSIS — O4290 Premature rupture of membranes, unspecified as to length of time between rupture and onset of labor, unspecified weeks of gestation: Secondary | ICD-10-CM

## 2019-03-03 DIAGNOSIS — Z20828 Contact with and (suspected) exposure to other viral communicable diseases: Secondary | ICD-10-CM | POA: Diagnosis present

## 2019-03-03 DIAGNOSIS — O3483 Maternal care for other abnormalities of pelvic organs, third trimester: Secondary | ICD-10-CM | POA: Diagnosis present

## 2019-03-03 DIAGNOSIS — N90810 Female genital mutilation status, unspecified: Secondary | ICD-10-CM | POA: Diagnosis present

## 2019-03-03 DIAGNOSIS — O43123 Velamentous insertion of umbilical cord, third trimester: Secondary | ICD-10-CM | POA: Diagnosis present

## 2019-03-03 DIAGNOSIS — O42913 Preterm premature rupture of membranes, unspecified as to length of time between rupture and onset of labor, third trimester: Secondary | ICD-10-CM | POA: Diagnosis not present

## 2019-03-03 DIAGNOSIS — Z3A39 39 weeks gestation of pregnancy: Secondary | ICD-10-CM | POA: Diagnosis not present

## 2019-03-03 DIAGNOSIS — O26893 Other specified pregnancy related conditions, third trimester: Secondary | ICD-10-CM | POA: Diagnosis present

## 2019-03-03 LAB — CBC
HCT: 37.6 % (ref 36.0–46.0)
Hemoglobin: 12.2 g/dL (ref 12.0–15.0)
MCH: 29.8 pg (ref 26.0–34.0)
MCHC: 32.4 g/dL (ref 30.0–36.0)
MCV: 91.7 fL (ref 80.0–100.0)
Platelets: 306 10*3/uL (ref 150–400)
RBC: 4.1 MIL/uL (ref 3.87–5.11)
RDW: 13.2 % (ref 11.5–15.5)
WBC: 7.8 10*3/uL (ref 4.0–10.5)
nRBC: 0 % (ref 0.0–0.2)

## 2019-03-03 LAB — POCT FERN TEST
POCT Fern Test: POSITIVE
POCT Fern Test: POSITIVE

## 2019-03-03 LAB — ABO/RH: ABO/RH(D): O POS

## 2019-03-03 LAB — TYPE AND SCREEN
ABO/RH(D): O POS
Antibody Screen: NEGATIVE

## 2019-03-03 LAB — RESPIRATORY PANEL BY RT PCR (FLU A&B, COVID)
Influenza A by PCR: NEGATIVE
Influenza B by PCR: NEGATIVE
SARS Coronavirus 2 by RT PCR: NEGATIVE

## 2019-03-03 MED ORDER — ONDANSETRON HCL 4 MG/2ML IJ SOLN: 4.0000 mg | Freq: Four times a day (QID) | INTRAMUSCULAR | Status: DC | PRN

## 2019-03-03 MED ORDER — OXYCODONE-ACETAMINOPHEN 5-325 MG PO TABS: 1.0000 | ORAL_TABLET | ORAL | Status: DC | PRN

## 2019-03-03 MED ORDER — TERBUTALINE SULFATE 1 MG/ML IJ SOLN: 0.2500 mg | Freq: Once | INTRAMUSCULAR | Status: DC | PRN

## 2019-03-03 MED ORDER — LACTATED RINGERS IV SOLN
500.0000 mL | INTRAVENOUS | Status: DC | PRN
  Administered 2019-03-03: 500 mL via INTRAVENOUS

## 2019-03-03 MED ORDER — LACTATED RINGERS IV SOLN: INTRAVENOUS | Status: DC

## 2019-03-03 MED ORDER — FLEET ENEMA 7-19 GM/118ML RE ENEM: 1.0000 | ENEMA | RECTAL | Status: DC | PRN

## 2019-03-03 MED ORDER — PHENYLEPHRINE 40 MCG/ML (10ML) SYRINGE FOR IV PUSH (FOR BLOOD PRESSURE SUPPORT)
80.0000 ug | PREFILLED_SYRINGE | INTRAVENOUS | Status: DC | PRN
  Filled 2019-03-03: qty 10

## 2019-03-03 MED ORDER — OXYTOCIN 40 UNITS IN NORMAL SALINE INFUSION - SIMPLE MED
2.5000 [IU]/h | INTRAVENOUS | Status: DC
  Filled 2019-03-03: qty 1000

## 2019-03-03 MED ORDER — LACTATED RINGERS IV SOLN
500.0000 mL | Freq: Once | INTRAVENOUS | Status: AC
  Administered 2019-03-03: 500 mL via INTRAVENOUS

## 2019-03-03 MED ORDER — DIPHENHYDRAMINE HCL 50 MG/ML IJ SOLN: 12.5000 mg | INTRAMUSCULAR | Status: DC | PRN

## 2019-03-03 MED ORDER — OXYTOCIN BOLUS FROM INFUSION
500.0000 mL | Freq: Once | INTRAVENOUS | Status: AC
  Administered 2019-03-04: 500 mL/h via INTRAVENOUS

## 2019-03-03 MED ORDER — LIDOCAINE HCL (PF) 1 % IJ SOLN
30.0000 mL | INTRAMUSCULAR | Status: AC | PRN
  Administered 2019-03-03: 22:00:00 11 mL via SUBCUTANEOUS

## 2019-03-03 MED ORDER — EPHEDRINE 5 MG/ML INJ: 10.0000 mg | INTRAVENOUS | Status: DC | PRN

## 2019-03-03 MED ORDER — OXYCODONE-ACETAMINOPHEN 5-325 MG PO TABS: 2.0000 | ORAL_TABLET | ORAL | Status: DC | PRN

## 2019-03-03 MED ORDER — OXYTOCIN 40 UNITS IN NORMAL SALINE INFUSION - SIMPLE MED
1.0000 m[IU]/min | INTRAVENOUS | Status: DC
  Administered 2019-03-03: 2 m[IU]/min via INTRAVENOUS

## 2019-03-03 MED ORDER — SOD CITRATE-CITRIC ACID 500-334 MG/5ML PO SOLN: 30.0000 mL | ORAL | Status: DC | PRN

## 2019-03-03 MED ORDER — SODIUM CHLORIDE (PF) 0.9 % IJ SOLN
INTRAMUSCULAR | Status: DC | PRN
  Administered 2019-03-03: 12 mL/h via EPIDURAL

## 2019-03-03 MED ORDER — FENTANYL-BUPIVACAINE-NACL 0.5-0.125-0.9 MG/250ML-% EP SOLN
12.0000 mL/h | EPIDURAL | Status: DC | PRN
  Filled 2019-03-03: qty 250

## 2019-03-03 MED ORDER — PHENYLEPHRINE 40 MCG/ML (10ML) SYRINGE FOR IV PUSH (FOR BLOOD PRESSURE SUPPORT)
80.0000 ug | PREFILLED_SYRINGE | INTRAVENOUS | Status: DC | PRN
  Administered 2019-03-03 (×2): 80 ug via INTRAVENOUS

## 2019-03-03 MED ORDER — ACETAMINOPHEN 325 MG PO TABS: 650.0000 mg | ORAL_TABLET | ORAL | Status: DC | PRN

## 2019-03-03 NOTE — MAU Provider Note (Signed)
First Provider Initiated Contact with Patient 03/03/19 1726       S: Ms. Kimberly Brock is a 38 y.o. [id number] at [redacted]w[redacted]d  who presents to MAU today complaining of leaking of fluid since 40 minutes prior to arrival. She denies vaginal bleeding. She endorses contractions. She reports normal fetal movement.    O: BP 107/73 (BP Location: Right Arm)   Pulse 79   Temp 98 F (36.7 C) (Oral)   Resp 15  GENERAL: Well-developed, well-nourished female in no acute distress.  HEAD: Normocephalic, atraumatic.  CHEST: Normal effort of breathing, regular heart rate ABDOMEN: Soft, nontender, gravid PELVIC: Normal external female genitalia. Vagina is pink and rugated. Cervix with normal contour, no lesions. Normal discharge.  Positive pooling.   Cervical exam: deferred     Fetal Monitoring: Baseline: 145 Variability: moderate Accelerations: 15x15 Decelerations: none Contractions: irregular  Results for orders placed or performed during the hospital encounter of 03/03/19 (from the past 24 hour(s))  POCT fern test     Status: None   Collection Time: 03/03/19  5:33 PM  Result Value Ref Range   POCT Fern Test Positive = ruptured amniotic membanes   Fern Test     Status: None   Collection Time: 03/03/19  6:19 PM  Result Value Ref Range   POCT Fern Test Positive = ruptured amniotic [place]banes     Pt informed that the ultrasound is considered a limited OB ultrasound and is not intended to be a complete ultrasound exam.  Patient also informed that the ultrasound is not being completed with the intent of assessing for fetal or placental anomalies or any pelvic abnormalities.  Explained that the purpose of today's ultrasound is to assess for  presentation.  Patient acknowledges the purpose of the exam and the limitations of the study.  cephalic   A: [facility]UP at [redacted]w[redacted]d  SROM  P: RN to call [facility] provider for admission orders  BSUS performed in MAU for presentation - cephalic, see above note  Jorje Guild, NP 03/03/2019 6:21 PM

## 2019-03-03 NOTE — Anesthesia Preprocedure Evaluation (Signed)
Anesthesia Evaluation  Patient identified by MRN, date of birth, ID band Patient awake    Reviewed: Allergy & Precautions, H&P , Patient's Chart, lab work & pertinent test results  Airway Mallampati: II  TM Distance: >3 FB Neck ROM: full    Dental   Pulmonary    breath sounds clear to auscultation       Cardiovascular  Rhythm:regular Rate:Normal     Neuro/Psych    GI/Hepatic   Endo/Other    Renal/GU      Musculoskeletal   Abdominal   Peds  Hematology   Anesthesia Other Findings   Reproductive/Obstetrics (+) Pregnancy                             Anesthesia Physical  Anesthesia Plan  ASA: II  Anesthesia Plan: Epidural   Post-op Pain Management:    Induction:   PONV Risk Score and Plan:   Airway Management Planned:   Additional Equipment:   Intra-op Plan:   Post-operative Plan:   Informed Consent: I have reviewed the patients History and Physical, chart, labs and discussed the procedure including the risks, benefits and alternatives for the proposed anesthesia with the patient or authorized representative who has indicated his/her understanding and acceptance.       Plan Discussed with:   Anesthesia Plan Comments:         Anesthesia Quick Evaluation

## 2019-03-03 NOTE — Progress Notes (Signed)
S: Resting in bed, was OOB on birthing ball for < 10 min. Staes ctx stronger and requests epidural.   O: Vitals:   03/03/19 1712 03/03/19 1927 03/03/19 1945  BP: 107/73    Pulse: 79    Resp: 15  16  Temp: 98 F (36.7 C)  98.2 F (36.8 C)  TempSrc: Oral  Oral  Weight:  89.4 kg   Height:  5\' 7"  (1.702 m)      FHT:  FHR: 130 bpm, variability: moderate,  accelerations:  Present,  decelerations:  Present small variable x 1 UC:   irregular, every 1-5 min SVE:   Dilation: 3 Effacement (%): Thick Station: -2 Exam by:: D Tarren Velardi CNM Clear AF  A / P: [id number] at [redacted]w[redacted]d   Latent labor, SROM x 6 hrs  Start Pitocin augmentation Frequent position changes with peanut ball to facilitate fetal descent Fetal Wellbeing:  Category I Pain Control:  Epidural pending GBS negative Anticipated MOD:  NSVD  Juliene Pina, CNM, MSN 03/03/2019, 10:05 PM

## 2019-03-03 NOTE — Anesthesia Procedure Notes (Signed)
Epidural Patient location during procedure: OB Start time: 03/03/2019 10:07 PM End time: 03/03/2019 10:22 PM  Staffing Anesthesiologist: Lynda Rainwater, MD Performed: anesthesiologist   Preanesthetic Checklist Completed: patient identified, IV checked, site marked, risks and benefits discussed, surgical consent, monitors and equipment checked, pre-op evaluation and timeout performed  Epidural Patient position: sitting Prep: ChloraPrep Patient monitoring: heart rate, cardiac monitor, continuous pulse ox and blood pressure Approach: midline Location: L2-L3 Injection technique: LOR saline  Needle:  Needle type: Tuohy  Needle gauge: 17 G Needle length: 9 cm Needle insertion depth: 4 cm Catheter type: closed end flexible Catheter size: 20 Guage Catheter at skin depth: 8 cm Test dose: negative  Assessment Events: blood not aspirated, injection not painful, no injection resistance, no paresthesia and negative IV test  Additional Notes Reason for block:procedure for pain

## 2019-03-03 NOTE — MAU Note (Signed)
Kimberly Brock is a 38 y.o. at [redacted]w[redacted]d here in MAU reporting:  LOF. Clear. Initial gush that has continued. Onset of complaint: 40 minutes ago Pain score: denies Vitals:   03/03/19 1712  BP: 107/73  Pulse: 79  Resp: 15  Temp: 98 F (36.7 C)    +FM Lab orders placed from triage: mau labor triage

## 2019-03-03 NOTE — H&P (Addendum)
OB ADMISSION/ HISTORY & PHYSICAL:  Admission Date: 03/03/2019  4:38 PM  Admit Diagnosis: Indication for care in labor or delivery [O75.9]    Kimberly Brock is a 38 y.o. female presenting for LOF, clear, since 1630 today.  Prenatal History: [id number]   EDC : 03/04/2019, by Other Basis  Prenatal care at [facility] since [place]. Primary - Dr. Cletis Brock   Prenatal course complicated by: AMA, FGM (from [place])  Prenatal Labs: ABO, Rh:   O pos Antibody: NEG (12/27 1829) Rubella:   Immune RPR:   neg HBsAg:   neg HIV:   neg GBS: Negative/-- (12/09 0000)  1 hr Glucola : 130 Genetic Screening: declined Ultrasound: normal XY anatomy, placenta R fundal Pelvis proven 8'4" NSVB x 4 uncomplicated, no hx PPH TdaP declined, flu UTD    Maternal Diabetes: No Genetic Screening: Declined Maternal Ultrasounds/Referrals: Normal Fetal Ultrasounds or other Referrals:  None Maternal Substance Abuse:  No Significant Maternal Medications:  None Significant Maternal Lab Results:  Group B Strep negative Other Comments:  None  Medical / Surgical History :  Past medical history:  Past Medical History:  Diagnosis Date  . Allergy   . Female circumcision      Past surgical history:  Past Surgical History:  Procedure Laterality Date  . CIRCUMCISION     female     Family History:  Family History  Problem Relation Age of Onset  . Cancer Neg Hx   . Stroke Neg Hx   . Heart disease Neg Hx   . Diabetes Neg Hx   . Hyperlipidemia Neg Hx   . Hypertension Neg Hx      Social History:  reports that she has never smoked. She has never used smokeless tobacco. She reports that she does not drink alcohol or use drugs.   Allergies: Patient has no known allergies.   Current Medications at time of admission:  PNV, Vit D, meclizine  Review of Systems: ROS + ctx, + LOF, no VB Denies HA/NV/RUQ pain  Physical Exam: Vital signs and nursing notes reviewed.  ED Triage Vitals  Enc Vitals Group     BP  03/03/19 1712 107/73     Pulse Rate 03/03/19 1712 79     Resp 03/03/19 1712 15     Temp 03/03/19 1712 98 F (36.7 C)     Temp Source 03/03/19 1712 Oral     SpO2 --      Weight 03/03/19 1927 197 lb (89.4 kg)     Height 03/03/19 1927 5\' 7"  (1.702 m)     Head Circumference --      Peak Flow --      Pain Score 03/03/19 1711 0     Pain Loc --      Pain Edu? --      Excl. in [place]? --      General: AAO x 3, NAD, coping well, desires epidural Heart: RRR Lungs:CTAB Abdomen: Gravid, NT, Leopold's vertex Extremities: no edema Genitalia / VE:   SVE 3/thick/-2, clear AF Vertex Old scarring from FGM, no infibulation  FHR: 130 BPM, mod variability, + accels, no decels TOCO: Ctx q 2-4 min, mild/mod to palp  Labs:   Pending T&S, RPR, COVID  Recent Labs    03/03/19 1829  WBC 7.8  HGB 12.2  HCT 37.6  PLT 306     Assessment:  38 y.o. [id number] at [redacted]w[redacted]d SROM w/ clear AF 1630 03/03/2019 AMA Muslim, dietary restriction - no pork  1. Early stage  of labor 2. FHR category I 3. GBS negative 4. Desires epidural in active labor 5. Breastfeeding 6. Placenta disposal per patient request  Plan:  1. Admit to BS 2. Routine L&D orders 3. Analgesia/anesthesia PRN  4. Expectant management, amy augment w/ Pitocin if no cervical change in next couple hours 5. Anticipate NSVB 6. Plans Mirena IUD for birth control.   Dr Kimberly Brock notified of admission / plan of care   Kimberly Brock CNM, MSN 03/03/2019, 8:05 PM

## 2019-03-04 ENCOUNTER — Encounter (HOSPITAL_COMMUNITY): Payer: Self-pay | Admitting: Family Medicine

## 2019-03-04 LAB — CBC
HCT: 36 % (ref 36.0–46.0)
Hemoglobin: 11.1 g/dL — ABNORMAL LOW (ref 12.0–15.0)
MCH: 29.8 pg (ref 26.0–34.0)
MCHC: 30.8 g/dL (ref 30.0–36.0)
MCV: 96.5 fL (ref 80.0–100.0)
Platelets: 228 10*3/uL (ref 150–400)
RBC: 3.73 MIL/uL — ABNORMAL LOW (ref 3.87–5.11)
RDW: 13.2 % (ref 11.5–15.5)
WBC: 13.3 10*3/uL — ABNORMAL HIGH (ref 4.0–10.5)
nRBC: 0 % (ref 0.0–0.2)

## 2019-03-04 LAB — RPR: RPR Ser Ql: NONREACTIVE

## 2019-03-04 MED ORDER — FLEET ENEMA 7-19 GM/118ML RE ENEM: 1.0000 | ENEMA | Freq: Every day | RECTAL | Status: DC | PRN

## 2019-03-04 MED ORDER — COCONUT OIL OIL: 1.0000 "application " | TOPICAL_OIL | Status: DC | PRN

## 2019-03-04 MED ORDER — IBUPROFEN 600 MG PO TABS
600.0000 mg | ORAL_TABLET | Freq: Four times a day (QID) | ORAL | Status: DC
  Administered 2019-03-04 – 2019-03-05 (×5): 600 mg via ORAL
  Filled 2019-03-04 (×5): qty 1

## 2019-03-04 MED ORDER — DIPHENHYDRAMINE HCL 25 MG PO CAPS: 25.0000 mg | ORAL_CAPSULE | Freq: Four times a day (QID) | ORAL | Status: DC | PRN

## 2019-03-04 MED ORDER — ONDANSETRON HCL 4 MG/2ML IJ SOLN
4.0000 mg | INTRAMUSCULAR | Status: DC | PRN
  Administered 2019-03-04: 4 mg via INTRAVENOUS
  Filled 2019-03-04: qty 2

## 2019-03-04 MED ORDER — BISACODYL 10 MG RE SUPP: 10.0000 mg | Freq: Every day | RECTAL | Status: DC | PRN

## 2019-03-04 MED ORDER — PRENATAL MULTIVITAMIN CH
1.0000 | ORAL_TABLET | Freq: Every day | ORAL | Status: DC
  Administered 2019-03-04: 1 via ORAL
  Filled 2019-03-04: qty 1

## 2019-03-04 MED ORDER — SIMETHICONE 80 MG PO CHEW: 80.0000 mg | CHEWABLE_TABLET | ORAL | Status: DC | PRN

## 2019-03-04 MED ORDER — METHYLERGONOVINE MALEATE 0.2 MG/ML IJ SOLN: 0.2000 mg | INTRAMUSCULAR | Status: DC | PRN

## 2019-03-04 MED ORDER — WITCH HAZEL-GLYCERIN EX PADS: 1.0000 "application " | MEDICATED_PAD | CUTANEOUS | Status: DC | PRN

## 2019-03-04 MED ORDER — ACETAMINOPHEN 325 MG PO TABS
650.0000 mg | ORAL_TABLET | ORAL | Status: DC | PRN
  Administered 2019-03-04 (×3): 650 mg via ORAL
  Filled 2019-03-04 (×3): qty 2

## 2019-03-04 MED ORDER — ZOLPIDEM TARTRATE 5 MG PO TABS: 5.0000 mg | ORAL_TABLET | Freq: Every evening | ORAL | Status: DC | PRN

## 2019-03-04 MED ORDER — TETANUS-DIPHTH-ACELL PERTUSSIS 5-2.5-18.5 LF-MCG/0.5 IM SUSP: 0.5000 mL | Freq: Once | INTRAMUSCULAR | Status: DC

## 2019-03-04 MED ORDER — SENNOSIDES-DOCUSATE SODIUM 8.6-50 MG PO TABS
2.0000 | ORAL_TABLET | ORAL | Status: DC
  Administered 2019-03-04: 2 via ORAL
  Filled 2019-03-04: qty 2

## 2019-03-04 MED ORDER — METHYLERGONOVINE MALEATE 0.2 MG PO TABS: 0.2000 mg | ORAL_TABLET | ORAL | Status: DC | PRN

## 2019-03-04 MED ORDER — BENZOCAINE-MENTHOL 20-0.5 % EX AERO: 1.0000 "application " | INHALATION_SPRAY | CUTANEOUS | Status: DC | PRN

## 2019-03-04 MED ORDER — DIBUCAINE (PERIANAL) 1 % EX OINT: 1.0000 "application " | TOPICAL_OINTMENT | CUTANEOUS | Status: DC | PRN

## 2019-03-04 MED ORDER — ONDANSETRON HCL 4 MG PO TABS: 4.0000 mg | ORAL_TABLET | ORAL | Status: DC | PRN

## 2019-03-04 MED ORDER — OXYCODONE HCL 5 MG PO TABS: 5.0000 mg | ORAL_TABLET | ORAL | Status: DC | PRN

## 2019-03-04 NOTE — Anesthesia Postprocedure Evaluation (Signed)
Anesthesia Post Note  Patient: Kimberly Brock  Procedure(s) Performed: AN AD HOC LABOR EPIDURAL     Patient location during evaluation: Mother Baby Anesthesia Type: Epidural Level of consciousness: awake Pain management: satisfactory to patient Vital Signs Assessment: post-procedure vital signs reviewed and stable Respiratory status: spontaneous breathing Cardiovascular status: stable Anesthetic complications: no    Last Vitals:  Vitals:   03/04/19 0905 03/04/19 1342  BP: 90/62 115/63  Pulse: 67 (!) 57  Resp: 17 17  Temp: 36.9 C 36.5 C  SpO2:      Last Pain:  Vitals:   03/04/19 1451  TempSrc:   PainSc: 3    Pain Goal:                   [employer]

## 2019-03-04 NOTE — Lactation Note (Signed)
This note was copied from a baby's chart. Lactation Consultation Note  Patient Name: Kimberly Brock [id number] Date: 03/04/2019 Reason for consult: Follow-up assessment;Term  2023 - I followed up with Kimberly Brock. She is a P5, who has breast fed all previous children for 6 months+. She states that her son, Donell Beers, is latching, and she is breast feeding on demand first and supplementing after. She states that she is supplementing because she has "no milk" at this time.   I reviewed differences between colostrum and mature milk and when to expect transitional milk to come in. I reassured Kimberly Brock that baby will feed frequently in the first three days to help with stimulating her breasts and to transfer adequate volume. I shared the supplementation (with breast feeding) guidelines and showed her the visual of the tummy size (marble, ping pong ball, egg). This visual aid was of interest to her, and she verbalized understanding of supplementation volumes based on baby's age.  Kimberly Brock will be on her own this evening; her husband will be with her other children. I encouraged her to call lactation for assistance.  We reviewed the feeding plan to breast feed on demand 8-12 times a day and supplement after, as needed. I educated on day 2 infant feeding patterns and cluster feeding behavior.  Feeding Feeding Type: Breast Milk with Formula added Nipple Type: Slow - flow   Interventions Interventions: Breast feeding basics reviewed  Lactation Tools Discussed/Used  Supplementation guidelines   Consult Status Consult Status: Follow-up Date: 03/05/19 Follow-up type: In-patient    Lenore Manner 03/04/2019, 8:40 PM

## 2019-03-04 NOTE — Lactation Note (Signed)
This note was copied from a baby's chart. Lactation Consultation Note  Patient Name: Kimberly Brock JOINO'M Date: 03/04/2019 Reason for consult: Initial assessment;Term P5, 4 hour female term infant. Mom is experienced at breastfeeding, she breastfeed her other 4 children for 2 years each. Mom's feeding choice at admission was breast and formula feeding. Per mom, she doesn't have a  breast pump at home but would like a manual one.  LC explained how to use use , assemble, re-assemble and clean the harmony hand pump. Per mom ,this is her 2nd time latching infant at breast, infant been feeding 15-20 minutes. Mom had latched infant on her right breast using the cross cradle hold as LC entered the room, infant mouth was wide. Mom knows to breastfeed according hunger cues, 8 to 12 times within 24 hours and on demand. Mom plans to breastfed infant and then supplement with formula afterwards. Mom knows to call RN or LC if she has any questions, concerns or need assistance with latching infant at breast. Reviewed Baby & Me book's Breastfeeding Basics.  Mom made aware of O/P services, breastfeeding support groups, community resources, and our phone # for post-discharge questions.      Maternal Data Formula Feeding for Exclusion: No Has patient been taught Hand Expression?: Yes Does the patient have breastfeeding experience prior to this delivery?: Yes  Feeding Feeding Type: Breast Fed Nipple Type: Slow - flow  LATCH Score Latch: Grasps breast easily, tongue down, lips flanged, rhythmical sucking.  Audible Swallowing: Spontaneous and intermittent  Type of Nipple: Everted at rest and after stimulation  Comfort (Breast/Nipple): Soft / non-tender  Hold (Positioning): No assistance needed to correctly position infant at breast.  LATCH Score: 10  Interventions Interventions: Breast feeding basics reviewed;Assisted with latch;Skin to skin;Adjust position;Support pillows;Breast  massage;Position options;Hand express;Expressed milk;Hand pump  Lactation Tools Discussed/Used WIC Program: No   Consult Status Consult Status: Follow-up Date: 03/04/19 Follow-up type: In-patient    Vicente Serene 03/04/2019, 6:51 AM

## 2019-03-05 MED ORDER — ACETAMINOPHEN 325 MG PO TABS: 650.0000 mg | ORAL_TABLET | ORAL | Status: DC | PRN

## 2019-03-05 MED ORDER — IBUPROFEN 600 MG PO TABS: 600.0000 mg | ORAL_TABLET | Freq: Four times a day (QID) | ORAL | 0 refills | Status: DC

## 2019-03-05 NOTE — Discharge Summary (Signed)
OB Discharge Summary     Patient Name: Kimberly Brock DOB: 12-30-1980 MRN: [id number]  Date of admission: 03/03/2019 Delivering MD: [name]   Date of discharge: 03/05/2019  Admitting diagnosis: Indication for care in labor or delivery [O75.9] Intrauterine pregnancy: [redacted]w[redacted]d     Secondary diagnosis:  Principal Problem:   Postpartum care following vaginal delivery 12/28 Active Problems:   SVD (spontaneous vaginal delivery)   Female circumcision  Additional problems:  Patient Active Problem List   Diagnosis Date Noted  . Postpartum care following vaginal delivery 12/28 03/04/2019  . Chronic pain of both knees 01/05/2018  . Osteoarthritis of both knees 01/05/2018  . Bilateral chronic serous otitis media 01/05/2018  . Racing heart beat 11/27/2017  . Abnormal ECG 11/27/2017  . Near syncope 11/27/2017  . Vitamin D deficiency 05/24/2016  . SVD (spontaneous vaginal delivery) 10/09/2013  . 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 Language barrier to communication 10/09/2013        Discharge diagnosis: Term Pregnancy Delivered                                                                                                Post partum procedures:one  Augmentation: Pitocin  Complications: None  Hospital course:  Onset of Labor With Vaginal Delivery     38 y.o. yo G5P5005 at [redacted]w[redacted]d was admitted in Latent Labor on 03/03/2019. Patient had an uncomplicated labor course as follows:  Membrane Rupture Time/Date: 4:30 PM ,03/03/2019   Intrapartum Procedures: Episiotomy: None [1]                                         Lacerations:  None [1]  Patient had a delivery of a Viable infant. 03/04/2019  Information for the patient's newborn:  Tram, Wrenn [[id number]]       Pateint had an uncomplicated postpartum course.  She is ambulating, tolerating a regular diet, passing flatus, and urinating well. Patient is discharged home in stable condition on 03/05/19.   Physical exam   Vitals:   03/04/19 0905 03/04/19 1342 03/04/19 1738 03/05/19 0642  BP: 90/62 115/63 98/66 107/80  Pulse: 67 (!) 57 72 66  Resp: 17 17 18 18   Temp: 98.4 F (36.9 C) 97.7 F (36.5 C) 98 F (36.7 C) (!) 97.5 F (36.4 C)  TempSrc: Oral Oral Axillary Axillary  SpO2:    100%  Weight:      Height:       General: alert, cooperative and no distress Lochia: appropriate Uterine Fundus: firm Incision: N/A DVT Evaluation: No evidence of DVT seen on physical exam. No cords or calf tenderness. No significant calf/ankle edema. Labs: Lab Results  Component Value Date   WBC 13.3 (H) 03/04/2019   HGB 11.1 (L) 03/04/2019   HCT 36.0 03/04/2019   MCV 96.5 03/04/2019   PLT 228 03/04/2019   CMP Latest Ref Rng & Units 11/29/2017  Glucose 70 - 99 mg/dL 99  BUN 6 - 20 mg/dL 9  Creatinine 12/01/2017 -  1.00 mg/dL 0.62  Sodium 135 - 145 mmol/L 141  Potassium 3.5 - 5.1 mmol/L 3.7  Chloride 98 - 111 mmol/L 106  CO2 22 - 32 mmol/L 28  Calcium 8.9 - 10.3 mg/dL 9.7  Total Protein 6.0 - 8.5 g/dL -  Total Bilirubin 0.0 - 1.2 mg/dL -  Alkaline Phos 39 - 117 IU/L -  AST 0 - 40 IU/L -  ALT 0 - 32 IU/L -    Discharge instruction: per After Visit Summary and "Baby and Me Booklet".  After visit meds:  Allergies as of 03/05/2019   No Known Allergies     Medication List    STOP taking these medications   All Day Allergy 10 MG Caps Generic drug: Cetirizine HCl   meclizine 25 MG tablet Commonly known as: ANTIVERT   meloxicam 15 MG tablet Commonly known as: MOBIC   Vitamin D (Ergocalciferol) 1.25 MG (50000 UT) Caps capsule Commonly known as: DRISDOL     TAKE these medications   acetaminophen 325 MG tablet Commonly known as: Tylenol Take 2 tablets (650 mg total) by mouth every 4 (four) hours as needed (for pain scale < 4).   ibuprofen 600 MG tablet Commonly known as: ADVIL Take 1 tablet (600 mg total) by mouth every 6 (six) hours.       Diet: routine diet  Activity: Advance as  tolerated. Pelvic rest for 6 weeks.   Outpatient follow up:6 weeks Follow up Appt:No future appointments. Follow up Visit:No follow-ups on file.  Postpartum contraception: IUD Mirena  Newborn Data: Live born female  Birth Weight: 8 lb 9.4 oz (3895 g) APGAR: 9, 9  Newborn Delivery   Birth date/time: 03/04/2019 02:17:00 Delivery type: Vaginal, Spontaneous      Baby Feeding: Breast Disposition:home with mother Plan out-patient circ with [facility]  03/05/2019 Arrie Eastern, CNM

## 2019-03-05 NOTE — Lactation Note (Signed)
This note was copied from a baby's chart. Lactation Consultation Note  Patient Name: Kimberly Brock ZLDJT'[id number] Date: 03/05/2019 Reason for consult: Follow-up assessment;Term  P5 mother whose infant is now 1 hours old.  Mother has good breast feeding experiences with her other four children.  She had no questions/concerns at this time.  Mother prefers to breast/bottle feed.  Baby was asleep in the bed next to mother when I arrived.  Mother desires a discharge today.  Engorgement prevention/treatment reviewed.  Mother has a manual pump for home use.  She does not have, nor does she desire, a DEBP.  Mother has our OP phone number for questions/concerns after discharge.  Father at home with other children.  Encouraged to call for my assistance as needed.   Maternal Data    Feeding    LATCH Score                   Interventions    Lactation Tools Discussed/Used     Consult Status Consult Status: Complete Date: 03/05/19 Follow-up type: Call as needed    Nillie Bartolotta R Loraine Bhullar 03/05/2019, 7:59 AM

## 2019-03-06 ENCOUNTER — Telehealth: Payer: Self-pay | Admitting: Medical

## 2019-03-06 NOTE — Telephone Encounter (Signed)
Called patient and Kimberly Brock

## 2019-03-06 NOTE — Telephone Encounter (Signed)
Please call and congratulate her on the new baby.  I got a note from the hospital advising of the new delivery! 

## 2019-04-04 ENCOUNTER — Telehealth: Payer: Self-pay

## 2019-04-04 NOTE — Telephone Encounter (Signed)
Pt. Husband called wanted to scheduled her a CPE but he wants and she wants it to be with a female requested Vickie but in the system Kimberly Brock is her PCP. I told him I would have to run it by both providers before scheduling apt and will call him back.

## 2019-04-04 NOTE — Telephone Encounter (Signed)
She has a gynecologist.  She can schedule with gynecology for her wellness visit.  Otherwise she can do a 2 part visit where she sees me for routine physical and Vickie for breast and pelvic exam.  Let us make sure [place]rene [place] is okay with this.  Given the cultural situation, husband tends to take charge of the visit answering for the patient sometimes.  Vickie may or may not be comfortable with this?  If not, then just asked down to do a physical with gynecology

## 2019-04-04 NOTE — Telephone Encounter (Signed)
It looks like Kimberly Brock has done her physicals in the past. I am ok if she needs a breast and pelvic exam to do this for her. Unless she already has an OB/gYN for this.

## 2019-04-05 NOTE — Telephone Encounter (Signed)
I called pt. To give her this information had to LM.

## 2019-05-29 ENCOUNTER — Other Ambulatory Visit: Payer: Self-pay | Admitting: Obstetrics and Gynecology

## 2019-05-29 DIAGNOSIS — R102 Pelvic and perineal pain: Secondary | ICD-10-CM

## 2019-06-04 ENCOUNTER — Ambulatory Visit
Admission: RE | Admit: 2019-06-04 | Discharge: 2019-06-04 | Disposition: A | Discharge status: Home / Self Care | Payer: Managed Care, Other (non HMO) | Source: Ambulatory Visit | Attending: Obstetrics and Gynecology | Admitting: Obstetrics and Gynecology

## 2019-06-04 DIAGNOSIS — R102 Pelvic and perineal pain: Secondary | ICD-10-CM

## 2019-11-22 ENCOUNTER — Ambulatory Visit (INDEPENDENT_AMBULATORY_CARE_PROVIDER_SITE_OTHER): Payer: Managed Care, Other (non HMO) | Admitting: Medical

## 2019-11-22 ENCOUNTER — Encounter: Payer: Self-pay | Admitting: Medical

## 2019-11-22 ENCOUNTER — Other Ambulatory Visit: Payer: Self-pay

## 2019-11-22 VITALS — BP 110/70 | HR 78 | Wt 188.6 lb

## 2019-11-22 DIAGNOSIS — H10021 Other mucopurulent conjunctivitis, right eye: Secondary | ICD-10-CM

## 2019-11-22 DIAGNOSIS — R519 Headache, unspecified: Secondary | ICD-10-CM | POA: Diagnosis not present

## 2019-11-22 MED ORDER — POLYMYXIN B-TRIMETHOPRIM 10000-0.1 UNIT/ML-% OP SOLN: 1.0000 [drp] | OPHTHALMIC | 0 refills | Status: DC

## 2019-11-22 NOTE — Progress Notes (Signed)
  Subjective: Kimberly Brock is a 39 y.o. female who presents for possible pink eye.   Patient presents for evaluation of discharge, erythema, foreign body sensation and tearing in the right eye.  They have noticed the above symptoms for 2 days.  Onset was gradual. Patient denies blurred vision, itching, photophobia and visual field deficit.   Associated symptoms include none.  Denies chills, dyspnea, myalgias, nasal congestion, nonproductive cough, sneezing and sore throat. She has tender spot in front of right ear though.   Using nothing for symptoms.  No other aggravating or relieving factors.  No other c/o.  Past Medical History:  Diagnosis Date  . Allergy   . Female circumcision     ROS as in subjective   Objective: BP 110/70   Pulse 78   Wt 188 lb 9.6 oz (85.5 kg)   SpO2 98%   BMI 29.54 kg/m   General appearance: alert, no distress Right eye with mild erythema, watery, no obvious foreign body, eyelids slightly swollen, no purulent discharge, left eye completely normal exam, bilateral PERRLA, EOMI PERRLA, EOMi, eyes otherwise unremarkable Ears: tender preauricular area on right but no induration, no fluctuance, no warmth or erythema.  TMs pearly, canals normal Nares patent, no discharge or erythema Pharynx normal, tonsils unremarkable Oral cavity: MMM, no lesions Neck: supple, no lymphadenopathy, no thyromegaly, no masses      Assessment  Encounter Diagnoses  Name Primary?  . Pink eye, right Yes  . Face pain       Plan: discussed diagnosis of conjunctivitis/pink eye.  Advised that pink eye is very contagious and spreads by direct contact.  Discussed treatment including moist warm compresses, antibiotic drops, avoid rubbing eyes, do not wear contact lenses or makeup until infection is resolved.  Discussed prevention, hand washing, not rubbing eyes.    Use some OTC ibuprofen and warm compresses over the tender right preauricular area.  No obvious abnormality other than  tenderness in that region, likely inflamed small lymph node  Patient was advised to call or return if worse or not improving in the next few days.    Patient voiced understanding of diagnosis, recommendations, and treatment plan.  After visit summary given.   Chelsa was seen today for eye problem.  Diagnoses and all orders for this visit:  Pink eye, right  Face pain  Other orders -     trimethoprim-polymyxin b (POLYTRIM) ophthalmic solution; Place 1 drop into the right eye every 4 (four) hours.

## 2019-11-22 NOTE — Patient Instructions (Signed)
Eye infection: Begin Polytrim antibiotic eye drops, 1 drop in right eye every 4 hours during the day If you start to get the same symptoms in the left, use them in the left eye too Wash hands with soap and water frequently Wash before and after using the drops Use mild soap and water in shower in mornings to clear out crusties Use the drops for about a week Begin over the counter Ibuprofen 200mg , 3 tablets twice daily for the next few days for the discomfort in front of the ear Use a warm moist rag or towel over the right face for 20 minutes on and off   If worse or not improving, recheck      I have included other useful information below for your review.   Bacterial Conjunctivitis Bacterial conjunctivitis (commonly called pink eye) is redness, soreness, or puffiness (inflammation) of the white part of your eye. It is caused by a germ called bacteria. These germs can easily spread from person to person (contagious). Your eye often will become red or pink. Your eye may also become irritated, watery, or have a thick discharge.  HOME CARE   Apply a cool, clean washcloth over closed eyelids. Do this for 10-20 minutes, 3-4 times a day while you have pain.  Gently wipe away any fluid coming from the eye with a warm, wet washcloth or cotton ball.  Wash your hands often with soap and water. Use paper towels to dry your hands.  Do not share towels or washcloths.  Change or wash your pillowcase every day.  Do not use eye makeup until the infection is gone.  Do not use machines or drive if your vision is blurry.  Stop using contact lenses. Do not use them again until your doctor says it is okay.  Do not touch the tip of the eye drop bottle or medicine tube with your fingers when you put medicine on the eye. GET HELP RIGHT AWAY IF:   Your eye is not better after 3 days of starting your medicine.  You have a yellowish fluid coming out of the eye.  You have more pain in the  eye.  Your eye redness is spreading.  Your vision becomes blurry.  You have a fever or lasting symptoms for more than 2-3 days.  You have a fever and your symptoms suddenly get worse.  You have pain in the face.  Your face gets red or puffy (swollen). MAKE SURE YOU:   Understand these instructions.  Will watch this condition.  Will get help right away if you are not doing well or get worse. Document Released: 12/01/2007 Document Revised: 02/08/2012 Document Reviewed: 10/28/2011 [facility] Patient Information 2015 [place], [place]. This information is not intended to replace advice given to you by your health care provider. Make sure you discuss any questions you have with your health care provider.    Viral Conjunctivitis Conjunctivitis is an irritation (inflammation) of the clear membrane that covers the white part of the eye (the conjunctiva). The irritation can also happen on the underside of the eyelids. Conjunctivitis makes the eye red or pink in color. This is what is commonly known as pink eye. Viral conjunctivitis can spread easily (contagious). CAUSES   Infection from virus on the surface of the eye.  Infection from the irritation or injury of nearby tissues such as the eyelids or cornea.  More serious inflammation or infection on the inside of the eye.  Other eye diseases.  The use  of certain eye medications. SYMPTOMS  The normally white color of the eye or the underside of the eyelid is usually pink or red in color. The pink eye is usually associated with irritation, tearing and some sensitivity to light. Viral conjunctivitis is often associated with a clear, watery discharge. If a discharge is present, there may also be some blurred vision in the affected eye. DIAGNOSIS  Conjunctivitis is diagnosed by an eye exam. The eye specialist looks for changes in the surface tissues of the eye which take on changes characteristic of the specific types of conjunctivitis. A  sample of any discharge may be collected on a Q-Tip (sterile swap). The sample will be sent to a lab to see whether or not the inflammation is caused by bacterial or viral infection. TREATMENT  Viral conjunctivitis will not respond to medicines that kill germs (antibiotics). Treatment is aimed at stopping a bacterial infection on top of the viral infection. The goal of treatment is to relieve symptoms (such as itching) with antihistamine drops or other eye medications.  HOME CARE INSTRUCTIONS   To ease discomfort, apply a cool, clean wash cloth to your eye for 10 to 20 minutes, 3 to 4 times a day.  Gently wipe away any drainage from the eye with a warm, wet washcloth or a cotton ball.  Wash your hands often with soap and use paper towels to dry.  Do not share towels or washcloths. This may spread the infection.  Change or wash your pillowcase every day.  You should not use eye make-up until the infection is gone.  Stop using contacts lenses. Ask your eye professional how to sterilize or replace them before using again. This depends on the type of contact lenses used.  Do not touch the edge of the eyelid with the eye drop bottle or ointment tube when applying medications to the affected eye. This will stop you from spreading the infection to the other eye or to others. SEEK IMMEDIATE MEDICAL CARE IF:   The infection has not improved within 3 days of beginning treatment.  A watery discharge from the eye develops.  Pain in the eye increases.  The redness is spreading.  Vision becomes blurred.  An oral temperature above 102 F (38.9 C) develops, or as your caregiver suggests.  Facial pain, redness or swelling develops.  Any problems that may be related to the prescribed medicine develop. MAKE SURE YOU:   Understand these instructions.  Will watch your condition.  Will get help right away if you are not doing well or get worse. Document Released: 02/21/2005 Document Revised:  05/16/2011 Document Reviewed: 10/11/2007 [facility] Patient Information 2015 [place], [place]. This information is not intended to replace advice given to you by your health care provider. Make sure you discuss any questions you have with your health care provider.

## 2020-04-23 ENCOUNTER — Encounter: Payer: Self-pay | Admitting: Family Medicine

## 2020-04-23 ENCOUNTER — Ambulatory Visit (INDEPENDENT_AMBULATORY_CARE_PROVIDER_SITE_OTHER): Payer: Managed Care, Other (non HMO) | Admitting: Family Medicine

## 2020-04-23 ENCOUNTER — Other Ambulatory Visit: Payer: Self-pay

## 2020-04-23 VITALS — BP 106/60 | HR 80 | Ht 67.0 in | Wt 186.0 lb

## 2020-04-23 DIAGNOSIS — M25551 Pain in right hip: Secondary | ICD-10-CM

## 2020-04-23 DIAGNOSIS — M7061 Trochanteric bursitis, right hip: Secondary | ICD-10-CM | POA: Diagnosis not present

## 2020-04-23 MED ORDER — MELOXICAM 15 MG PO TABS
15.0000 mg | ORAL_TABLET | Freq: Every day | ORAL | 0 refills | Status: AC
Start: 2020-04-23 — End: ?

## 2020-04-23 NOTE — Progress Notes (Signed)
Chief Complaint  Patient presents with  . Hip Pain    Right sided hip pain 2-3 months that is worsening.    She had been having R hip pain periodically for a while, having pain after being very active, but not daily.  For the last 2-3 months, she has been having pain daily. She has taken ibuprofen 600mg  sporadically, taking it once daily for the last 3 days. It helped some at first, not in the last 3 days.  Denies GI side effects.   She also took Tylenol twice, helped some, felt like ibuprofen helped a little more.  She denies any trauma, fall, injury or change in activity.  PMH, PSH, SH reviewed  Outpatient Encounter Medications as of 04/23/2020  Medication Sig Note  . levonorgestrel (MIRENA, 52 MG,) 20 MCG/24HR IUD Mirena 20 mcg/24 hours (7 yrs) 52 mg intrauterine device  Take 1 device by intrauterine route.   . Multiple Vitamins-Minerals (MULTIVITAMIN WITH MINERALS) tablet Take 1 tablet by mouth daily. 04/23/2020: 4 times a week  . acetaminophen (TYLENOL) 325 MG tablet Take 2 tablets (650 mg total) by mouth every 4 (four) hours as needed (for pain scale < 4). (Patient not taking: No sig reported)   . ibuprofen (ADVIL) 600 MG tablet Take 1 tablet (600 mg total) by mouth every 6 (six) hours. (Patient not taking: No sig reported)   . [DISCONTINUED] trimethoprim-polymyxin b (POLYTRIM) ophthalmic solution Place 1 drop into the right eye every 4 (four) hours.    No facility-administered encounter medications on file as of 04/23/2020.   No Known Allergies  ROS: no fever, chills, headaches, URI symptoms, chest pain, shortness of breath. No GI complaints, rashes. Hip pain per HPI.   PHYSICAL EXAM:  BP 106/60   Pulse 80   Ht 5\' 7"  (1.702 m)   Wt 186 lb (84.4 kg)   LMP 03/31/2020 (Approximate)   Breastfeeding Yes   BMI 29.13 kg/m   Pleasant, well-appearing female, in no distress Heart: regular rate and rhythm Lungs: clear bilaterally Neck: no lymphadenopathy or mass Abdomen: soft,  nontender Back: no spinal or CVA tenderness, no SI joint tenderness Extremities: no edema FROM of hips bilaterally, without pain. She is tender at R trochanteric bursa, but tenderness also extends posteriorly, over the muscles. She had discomfort with pyriformis stretches Neuro: normal strength, DTR's, gait   ASSESSMENT/PLAN:  Right hip pain - related to deep buttock muscles (pyriformis, gluteus), and trochanteric bursa. Shown stretches. NSAID trial - Plan: meloxicam (MOBIC) 15 MG tablet  Trochanteric bursitis of right hip - Risks/SE of NSAIDs reviewed.  To take for 7-10d, up to 2 wks (she requested #30 to have on hand for prn). f/u if not resolving, consider PT - Plan: meloxicam (MOBIC) 15 MG tablet   Your hip pain is related to the deep muscles in the buttock, and also possibly inflammation in the bursa (bursitis at the hip). This is treated with anti-inflammatories and stretching. If pain doesn't resolve with the recommendations discussed today, the next step would be referral for physical therapy.  Take meloxicam once daily with food. Do not use ibuprofen/advil/motrin/aleve/naproxen. If you are having pain and need another medication, it is safe to take Tylenol (acetaminophen) along with the meloxicam. Take the medication daily until your pain resolves (likely 7-10 days, you may use for up to 2 weeks at a time). You will have leftover to use in case the pain recurs.  Stop using the medication, or cut the dose in half, if it  bothers your stomach.  Do the stretches shown today twice daily.  Doing it after a hot shower or heating pad will allow for a deeper stretch.  I spent 31 minutes dedicated to the care of this patient, including pre-visit review of records, face to face time, post-visit ordering of testing and documentation.

## 2020-04-23 NOTE — Patient Instructions (Signed)
  Your hip pain is related to the deep muscles in the buttock (pyriformis, gluteus), and also possibly inflammation in the bursa (bursitis at the hip). This is treated with anti-inflammatories and stretching. If pain doesn't resolve with the recommendations discussed today, the next step would be referral for physical therapy.  Take meloxicam once daily with food. Do not use ibuprofen/advil/motrin/aleve/naproxen. If you are having pain and need another medication, it is safe to take Tylenol (acetaminophen) along with the meloxicam. Take the medication daily until your pain resolves (likely 7-10 days, you may use for up to 2 weeks at a time). You will have leftover to use in case the pain recurs.  Stop using the medication, or cut the dose in half, if it bothers your stomach.  Do the stretches shown today twice daily.  Doing it after a hot shower or heating pad will allow for a deeper stretch.

## 2020-08-07 ENCOUNTER — Ambulatory Visit (INDEPENDENT_AMBULATORY_CARE_PROVIDER_SITE_OTHER): Payer: 59 | Admitting: Medical

## 2020-08-07 ENCOUNTER — Other Ambulatory Visit: Payer: Self-pay

## 2020-08-07 VITALS — BP 120/72 | HR 78 | Wt 200.8 lb

## 2020-08-07 DIAGNOSIS — Z7184 Encounter for health counseling related to travel: Secondary | ICD-10-CM

## 2020-08-07 DIAGNOSIS — Z298 Encounter for other specified prophylactic measures: Secondary | ICD-10-CM

## 2020-08-07 DIAGNOSIS — Z2820 Immunization not carried out because of patient decision for unspecified reason: Secondary | ICD-10-CM

## 2020-08-07 MED ORDER — MEFLOQUINE HCL 250 MG PO TABS: 250.0000 mg | ORAL_TABLET | ORAL | 0 refills | Status: AC

## 2020-08-07 NOTE — Patient Instructions (Signed)
Malaria prophylaxis   Ideally we would have started this 2 weeks ago  So instead lets take 2 tablets today which would be 500 mg today for the first week  Then you will take 250 mg or 1 tablet weekly while you are gone to [place]  And you will need to take 250 mg weekly tablet for 4 more weeks when you get back  If you get ill while you are alone such as fever, body aches, chills, sick feeling then go get evaluated

## 2020-08-07 NOTE — Progress Notes (Signed)
Subjective:  Kimberly Brock is a 40 y.o. female who presents for Chief Complaint  Patient presents with  . traveling    Traveling out of country Saturday and needs some malaria     Here today for travel advice.  She is leaving to go back to [place] in 2 days.  She would like prescription for malaria prophylaxis.  She has taken malaria prophylaxis before, chloroquine back in 2015.  She is breast-feeding.  She plans to breast-feed for about another month.  Her son is 1 year and 86 months old.  She declines any vaccines.  She does not have a vaccine record with her.  She had a COVID test within the last 2 days that she is going to take with her.  No other aggravating or relieving factors.    No other c/o.  The following portions of the patient's history were reviewed and updated as appropriate: allergies, current medications, past family history, past medical history, past social history, past surgical history and problem list.  ROS Otherwise as in subjective above  Objective: BP 120/72   Pulse 78   Wt 200 lb 12.8 oz (91.1 kg)   BMI 31.45 kg/m   General appearance: alert, no distress, well developed, well nourished Healthy appearing   Assessment: Encounter Diagnoses  Name Primary?  . Travel advice encounter Yes  . Need for malaria prophylaxis   . Vaccine refused by patient      Plan: I reviewed CDC recommendations regarding prophylactics and vaccinations.  We specifically discussed yellow fever and polio recommendations for vaccines.  However she declines all vaccines.  The only vaccines we have on file is Tdap in 2015 and prior flu shot in 2015.  She does not want any vaccines today despite the recommendation  She notes having a negative COVID test within the last 2 days done elsewhere.  We discussed other general travel precautions.  We discussed 4 options for prophylaxis including tafenoquine, atovaquone proguanil, mefloquine and doxycycline.  After giving her  options and discussing relative risk with breast-feeding she prefers the weekly mefloquine.  We discussed that she ideally would have started this 2 weeks ago.  So I will give her a initial loading dose of 500 mg today and then 250 mg weekly after that and 250 mg weekly for 4 weeks after she returns  [place] was seen today for traveling.  Diagnoses and all orders for this visit:  Travel advice encounter  Need for malaria prophylaxis  Vaccine refused by patient  Other orders -     mefloquine (LARIAM) 250 MG tablet; Take 1 tablet (250 mg total) by mouth every 7 (seven) days. Once weekly starting 3 weeks before trip, weekly during trip, and for 4 weeks after returning to the [place]    Follow up: prn

## 2020-08-17 IMAGING — DX DG CHEST 2V
2 series · 2 of 2 positions shown · non-contrast
Comparison: 11/23/2007

CLINICAL DATA: Chest pain

EXAM:
CHEST - 2 VIEW

[chest pa]
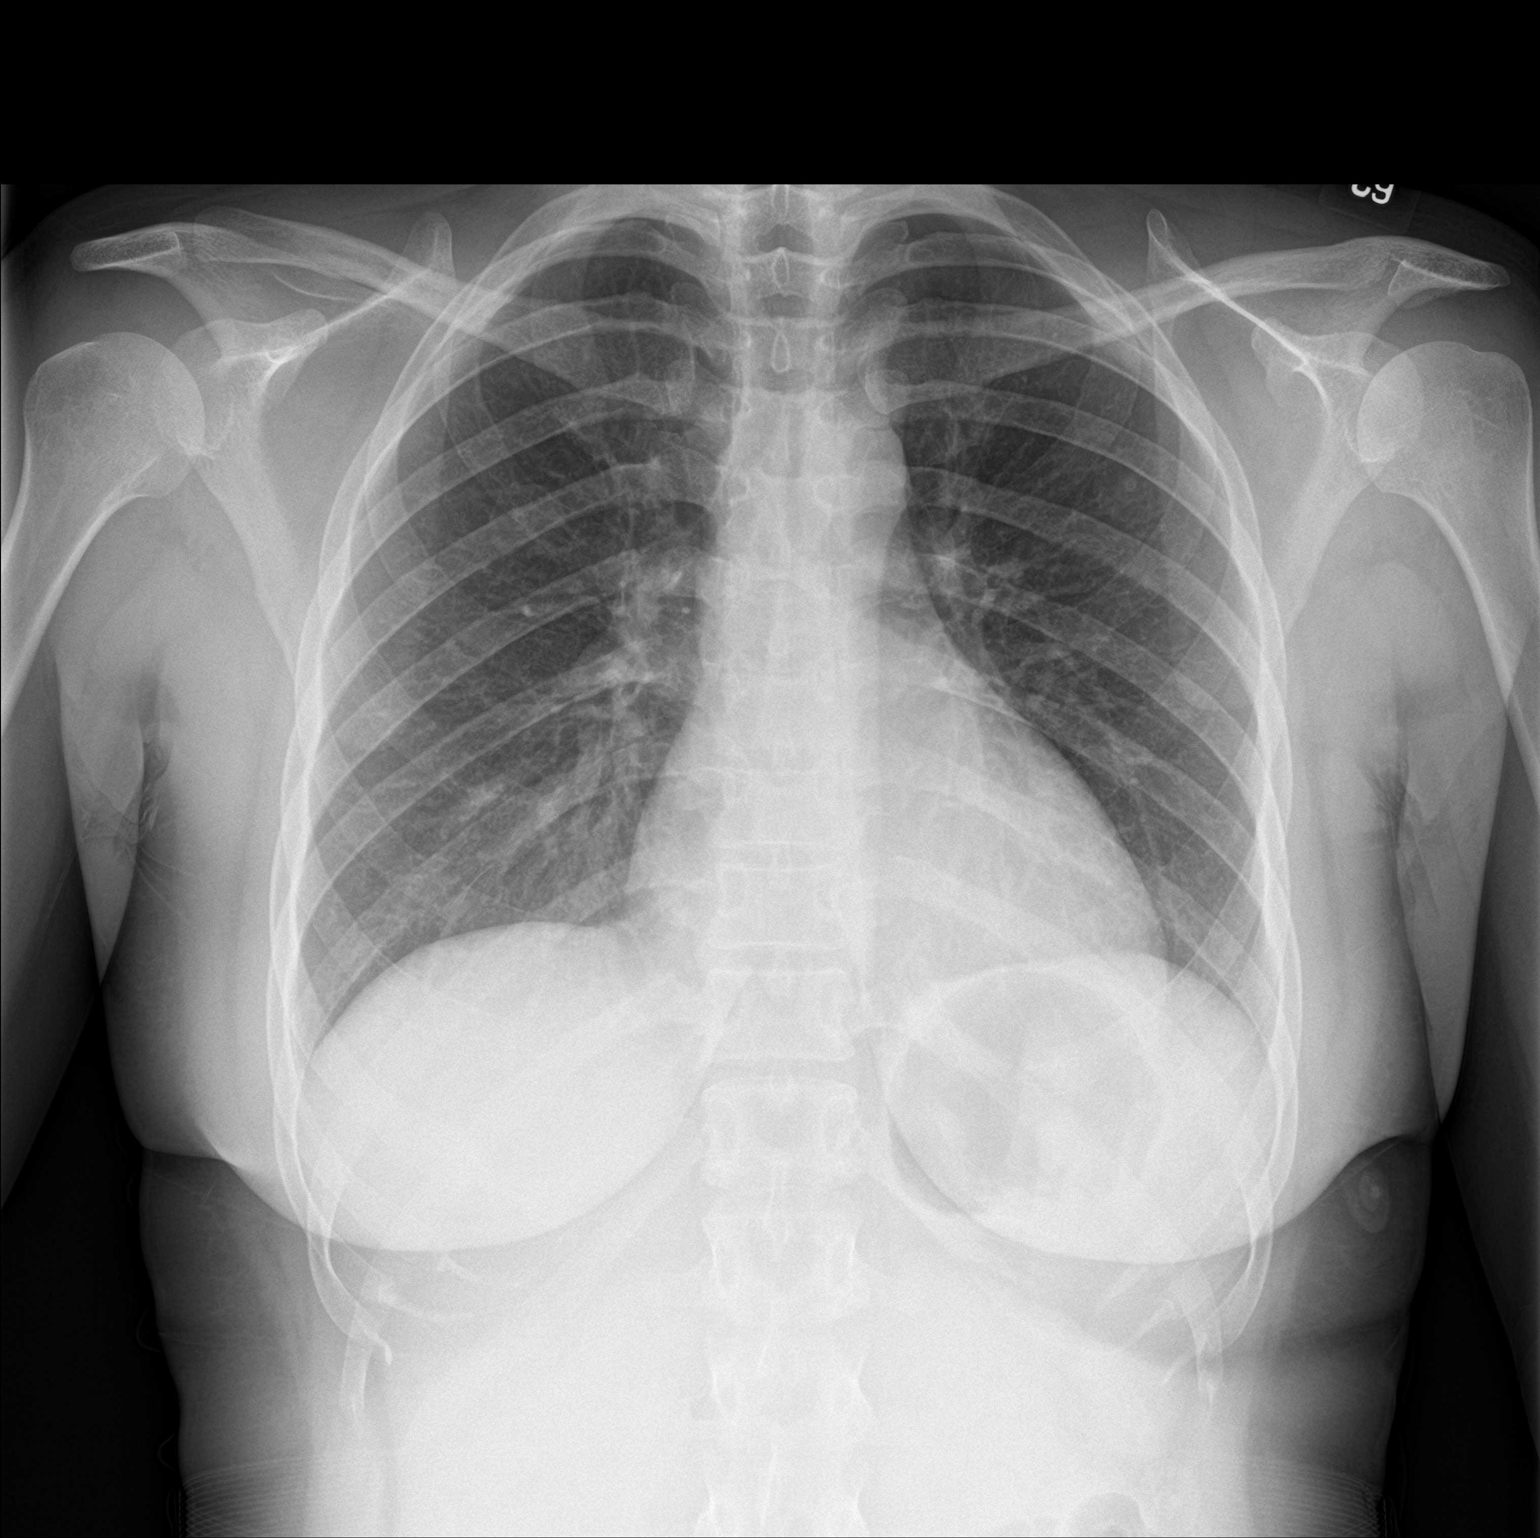

[chest lat]
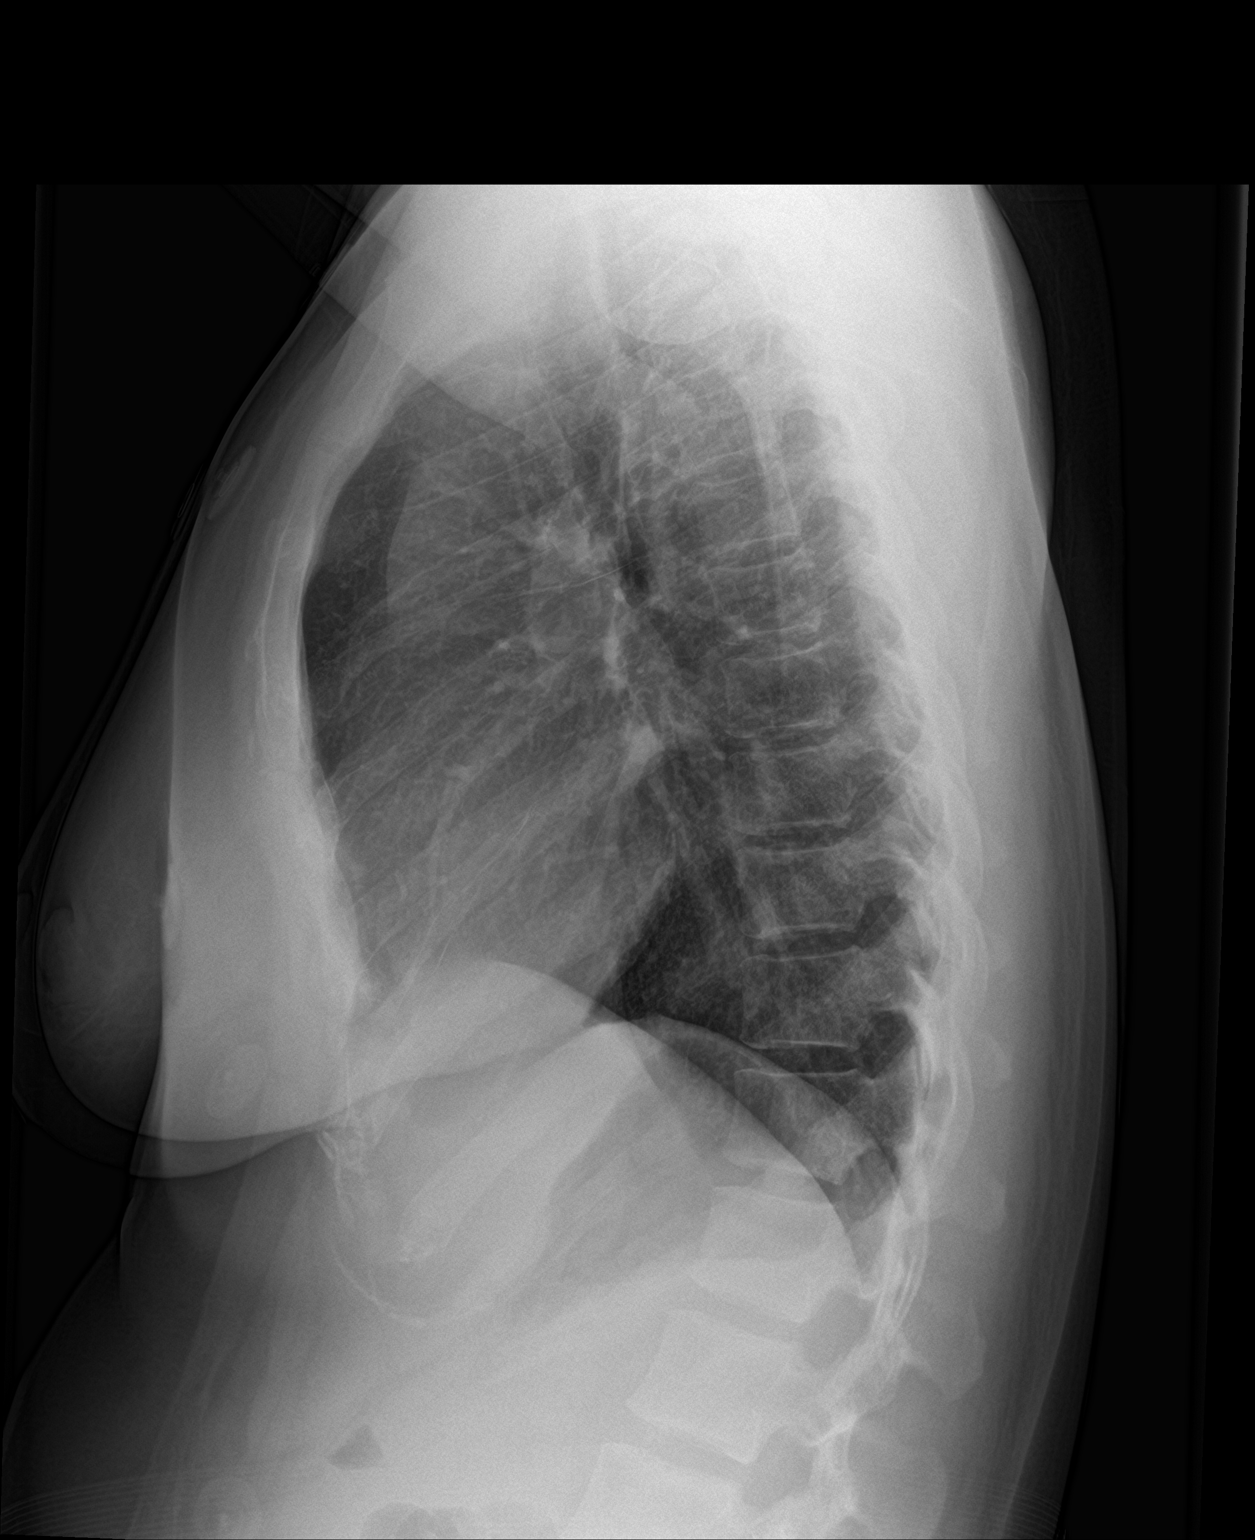

[2 of 2 positions shown; findings below may reference images not displayed]

FINDINGS: The heart size and mediastinal contours are within normal limits.
Both lungs are clear. The visualized skeletal structures are
unremarkable.
IMPRESSION: No active cardiopulmonary disease.

## 2020-08-17 IMAGING — CT CT ANGIO CHEST
2 of 8 series · 19 of 46 positions shown · IV contrast (iopamidol)
Comparison: None.

CLINICAL DATA: Left chest pain, dizziness

EXAM:
CT ANGIOGRAPHY CHEST WITH CONTRAST
TECHNIQUE: Multidetector CT imaging of the chest was performed using the
standard protocol during bolus administration of intravenous
contrast. Multiplanar CT image reconstructions and MIPs were
obtained to evaluate the vascular anatomy.
CONTRAST:  100mL [id number] IOPAMIDOL ([id number]) INJECTION 76%

[Series 6: thins · axial · 0.67mm/px · z∈[+1130,+1386]mm · 16 of 283 slices shown]
[im 13/283  lung]
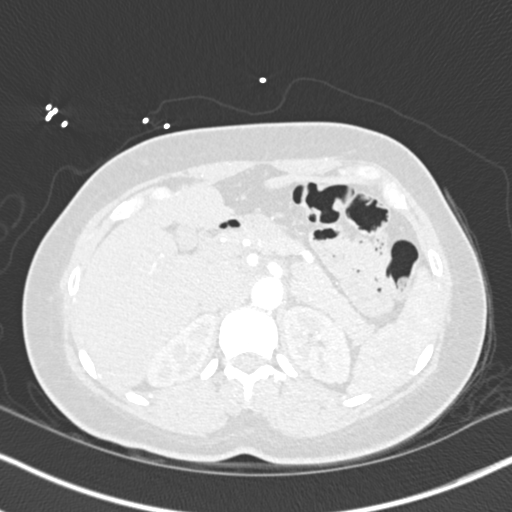
[im 26/283  soft-tissue]
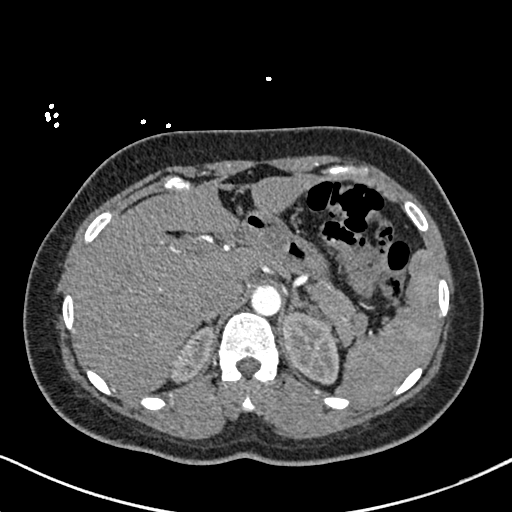
[im 52/283  lung]
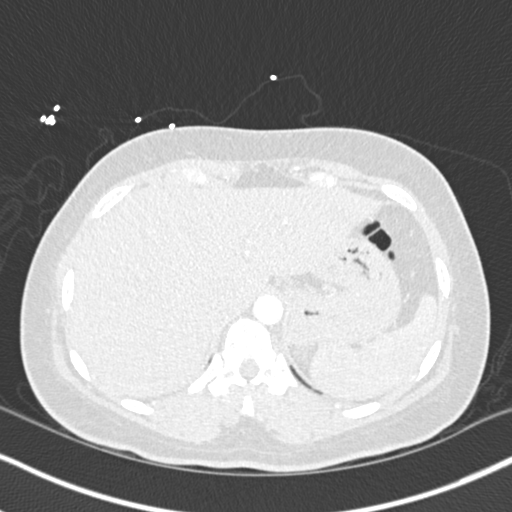
[im 65/283  soft-tissue]
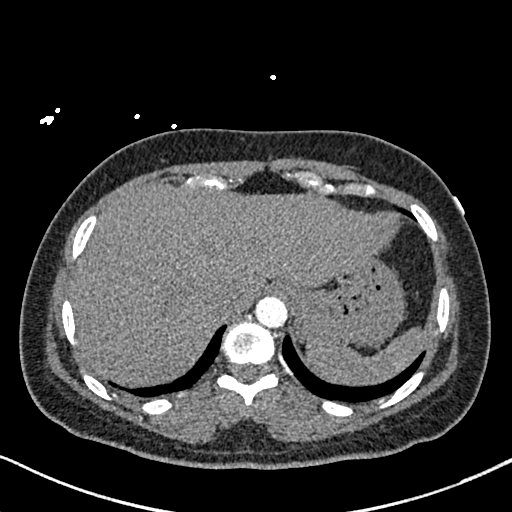
[im 77/283  lung]
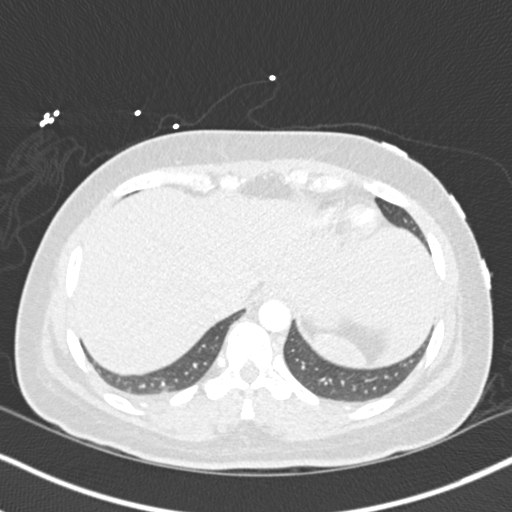
[im 103/283  soft-tissue]
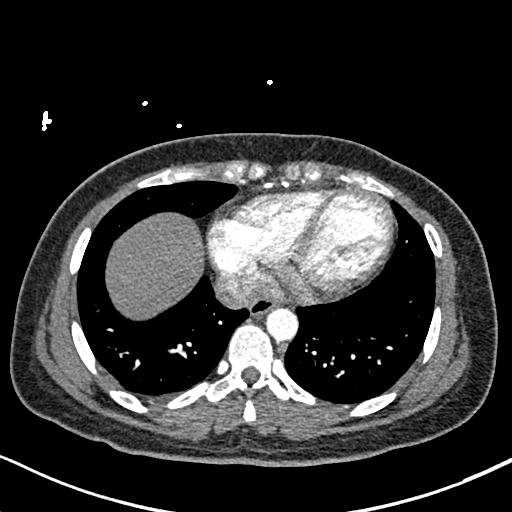
[im 116/283  lung]
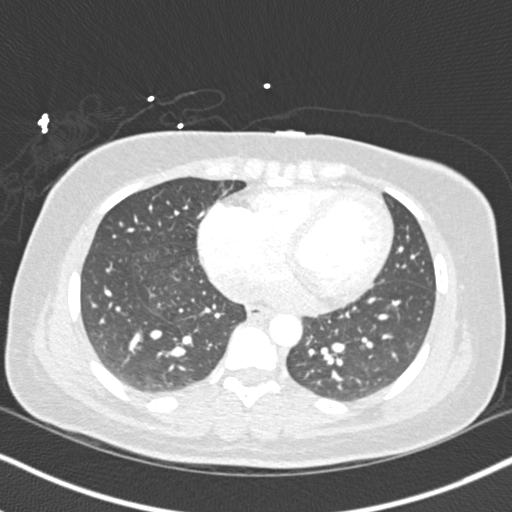
[im 129/283  soft-tissue]
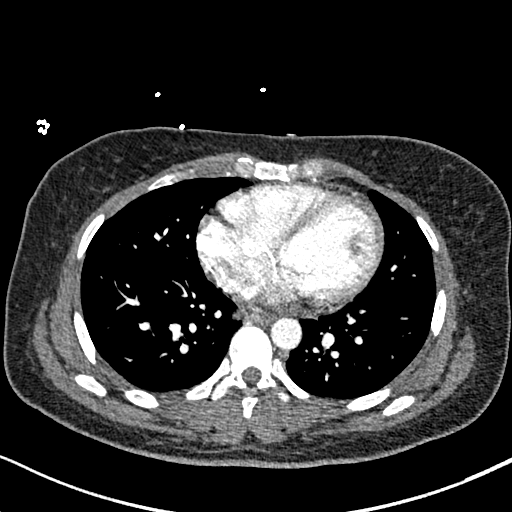
[im 154/283  lung]
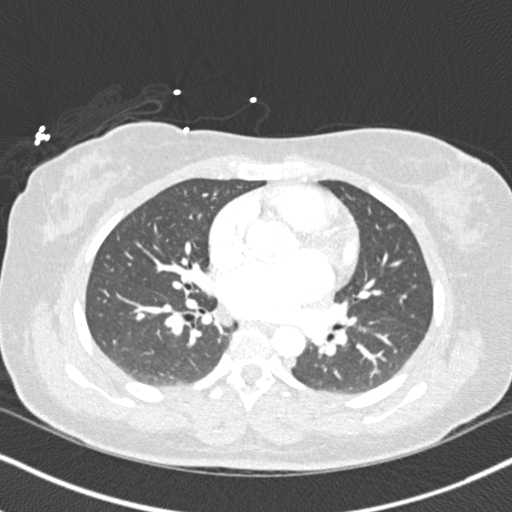
[im 167/283  soft-tissue]
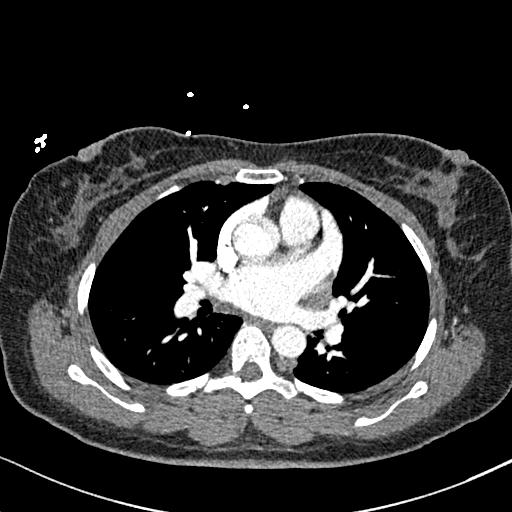
[im 180/283  lung]
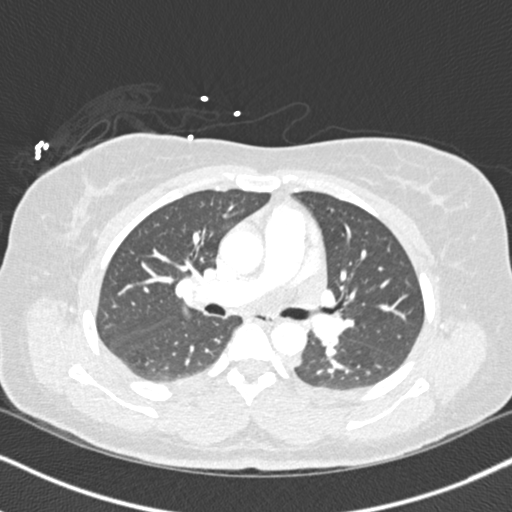
[im 206/283  soft-tissue]
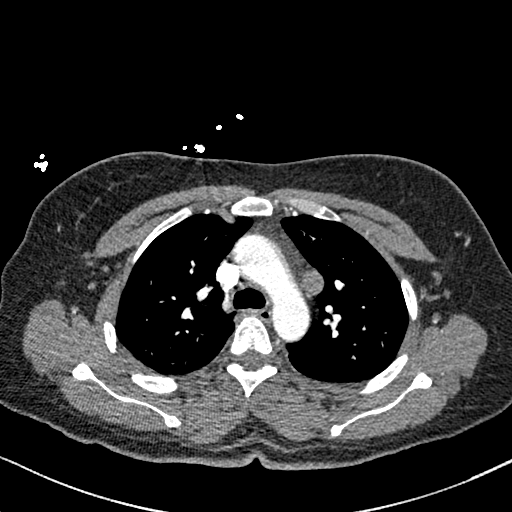
[im 218/283  lung]
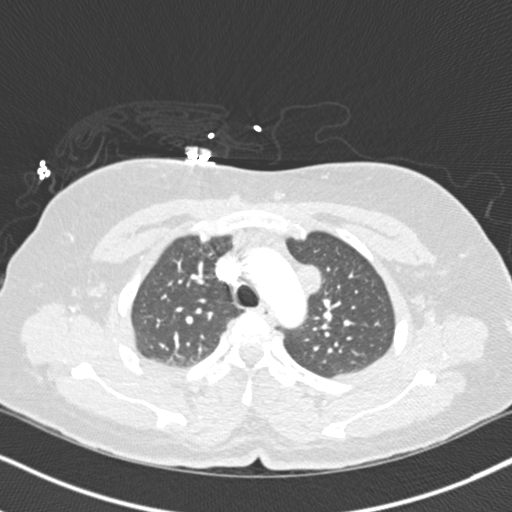
[im 231/283  soft-tissue]
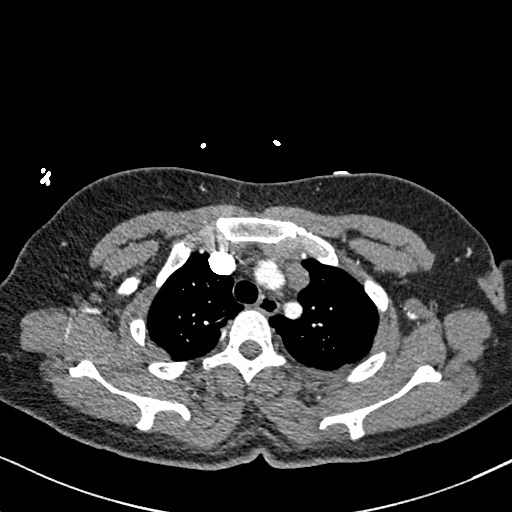
[im 257/283  lung]
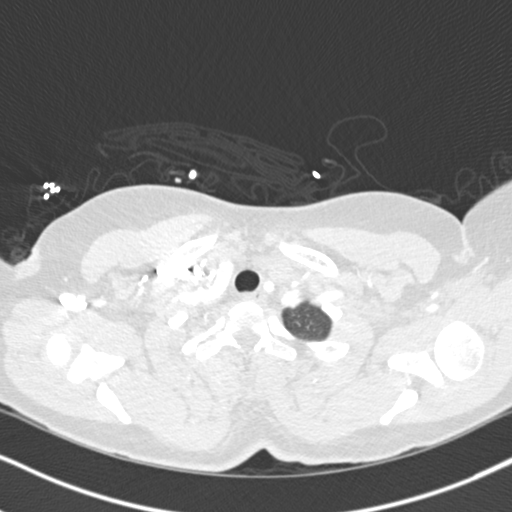
[im 270/283  soft-tissue]
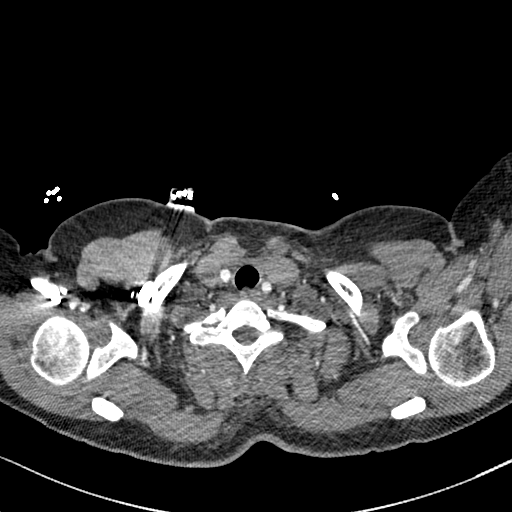

[Series 8: coronal mpr · coronal · 0.65mm/px · 3 of 151 slices shown]
[im 38/151  soft-tissue]
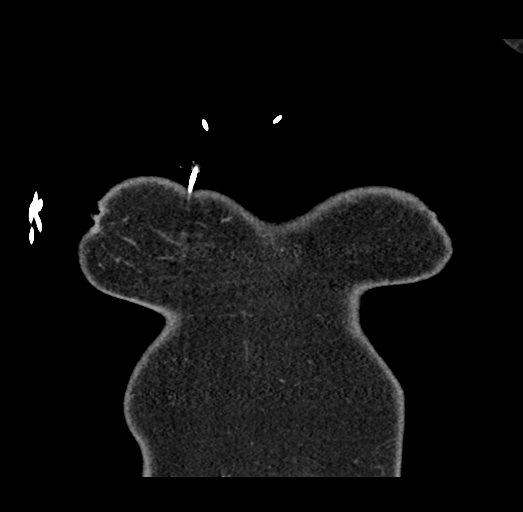
[im 76/151  soft-tissue]
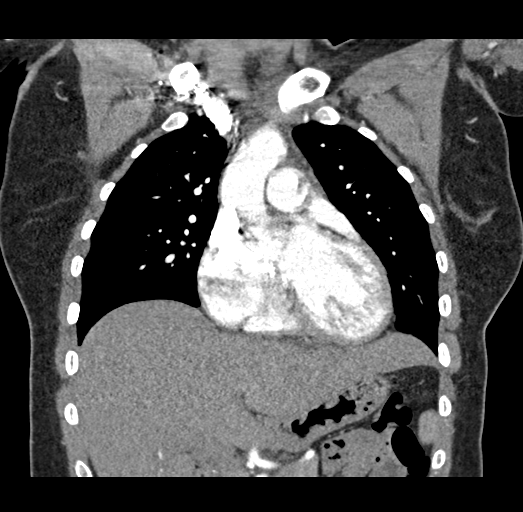
[im 113/151  soft-tissue]
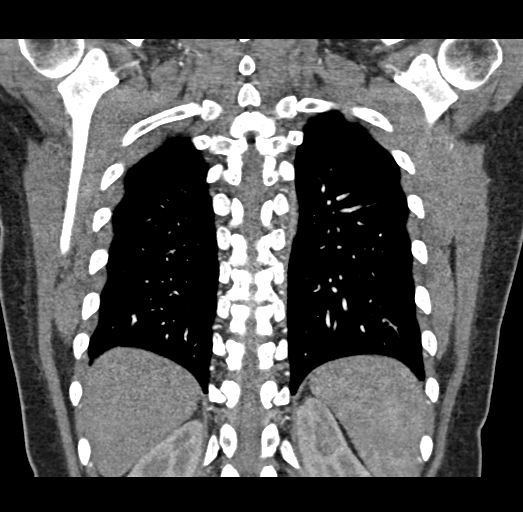

[19 of 46 positions shown; findings below may reference images not displayed]

FINDINGS: Cardiovascular: No filling defects in the pulmonary arteries to
suggest pulmonary emboli. Heart is normal size. Aorta is normal
caliber. Persistent/duplicated left SVC.

Mediastinum/Nodes: No mediastinal, hilar, or axillary adenopathy.

Lungs/Pleura: Lungs are clear. No focal airspace opacities or
suspicious nodules. No effusions.

Upper Abdomen: Imaging into the upper abdomen shows no acute
findings.

Musculoskeletal: Chest wall soft tissues are unremarkable. No acute
bony abnormality.

Review of the MIP images confirms the above findings.
IMPRESSION: No evidence of pulmonary embolus.  No acute cardiopulmonary disease.

## 2020-11-18 ENCOUNTER — Encounter: Payer: Self-pay | Admitting: Internal Medicine

## 2021-11-10 ENCOUNTER — Encounter: Payer: Self-pay | Admitting: Internal Medicine

## 2021-12-14 ENCOUNTER — Encounter: Payer: Self-pay | Admitting: Internal Medicine

## 2022-02-18 ENCOUNTER — Encounter: Payer: Medicaid Other | Admitting: Medical
